# Patient Record
Sex: Female | Born: 2000 | Race: White | Hispanic: No | Marital: Single | Smoking: Never smoker
Health system: Southern US, Community
[De-identification: ages and names within clinical notes are randomized; demographics above are authoritative.]

## PROBLEM LIST (undated history)

## (undated) DIAGNOSIS — R51 Headache: Secondary | ICD-10-CM

## (undated) DIAGNOSIS — Z8489 Family history of other specified conditions: Secondary | ICD-10-CM

## (undated) DIAGNOSIS — R519 Headache, unspecified: Secondary | ICD-10-CM

## (undated) HISTORY — DX: Headache, unspecified: R51.9

## (undated) HISTORY — DX: Headache: R51

---
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
## 2020-03-06 ENCOUNTER — Other Ambulatory Visit: Payer: Self-pay | Admitting: Internal Medicine

## 2020-03-07 ENCOUNTER — Encounter: Payer: Self-pay | Admitting: Internal Medicine

## 2020-03-08 MED ORDER — SUMATRIPTAN SUCCINATE 50 MG PO TABS: 50.0000 mg | ORAL_TABLET | ORAL | 2 refills | Status: DC | PRN

## 2020-04-22 ENCOUNTER — Other Ambulatory Visit: Payer: Self-pay | Admitting: Internal Medicine

## 2020-04-22 DIAGNOSIS — G43839 Menstrual migraine, intractable, without status migrainosus: Secondary | ICD-10-CM

## 2020-05-27 ENCOUNTER — Other Ambulatory Visit: Payer: Self-pay | Admitting: Internal Medicine

## 2020-06-08 ENCOUNTER — Other Ambulatory Visit: Payer: Self-pay | Admitting: Internal Medicine

## 2020-06-08 DIAGNOSIS — G43839 Menstrual migraine, intractable, without status migrainosus: Secondary | ICD-10-CM

## 2020-06-09 ENCOUNTER — Encounter: Payer: Self-pay | Admitting: Internal Medicine

## 2020-06-09 ENCOUNTER — Other Ambulatory Visit: Payer: Self-pay | Admitting: Internal Medicine

## 2020-06-11 ENCOUNTER — Other Ambulatory Visit: Payer: Self-pay

## 2020-06-11 DIAGNOSIS — G43839 Menstrual migraine, intractable, without status migrainosus: Secondary | ICD-10-CM

## 2020-06-11 MED ORDER — TOPIRAMATE 25 MG PO TABS: ORAL_TABLET | ORAL | 0 refills | Status: DC

## 2020-06-11 MED ORDER — SULFACETAMIDE SODIUM (ACNE) 10 % EX LOTN: 1.0000 "application " | TOPICAL_LOTION | Freq: Every day | CUTANEOUS | 2 refills | Status: DC

## 2020-06-11 MED ORDER — SUMATRIPTAN SUCCINATE 50 MG PO TABS: 50.0000 mg | ORAL_TABLET | ORAL | 2 refills | Status: DC | PRN

## 2020-06-11 NOTE — Telephone Encounter (Signed)
Duplicate request

## 2020-06-11 NOTE — Telephone Encounter (Signed)
Imitrex last filled 03/08/2020 x 2 refills... please advise

## 2020-06-25 ENCOUNTER — Encounter: Payer: Self-pay | Admitting: Internal Medicine

## 2020-06-25 NOTE — Telephone Encounter (Signed)
This really needs to be in person

## 2020-06-27 ENCOUNTER — Ambulatory Visit: Payer: BC Managed Care – PPO | Admitting: Internal Medicine

## 2020-06-28 ENCOUNTER — Encounter: Payer: Self-pay | Admitting: Internal Medicine

## 2020-06-28 ENCOUNTER — Other Ambulatory Visit: Payer: Self-pay

## 2020-06-28 ENCOUNTER — Ambulatory Visit (INDEPENDENT_AMBULATORY_CARE_PROVIDER_SITE_OTHER): Payer: Self-pay | Admitting: Internal Medicine

## 2020-06-28 DIAGNOSIS — F419 Anxiety disorder, unspecified: Secondary | ICD-10-CM | POA: Insufficient documentation

## 2020-06-28 DIAGNOSIS — F32A Depression, unspecified: Secondary | ICD-10-CM | POA: Insufficient documentation

## 2020-06-28 DIAGNOSIS — F411 Generalized anxiety disorder: Secondary | ICD-10-CM | POA: Insufficient documentation

## 2020-06-28 DIAGNOSIS — G43839 Menstrual migraine, intractable, without status migrainosus: Secondary | ICD-10-CM

## 2020-06-28 MED ORDER — ESCITALOPRAM OXALATE 5 MG PO TABS: 5.0000 mg | ORAL_TABLET | Freq: Every day | ORAL | 2 refills | Status: DC

## 2020-06-28 NOTE — Assessment & Plan Note (Signed)
Deteriorated due to anxiety Continue Nuvaring, Topamax and Imitrex at this time If symptoms persist, consider increasing Topamax

## 2020-06-28 NOTE — Patient Instructions (Signed)
http://NIMH.NIH.Gov">  Generalized Anxiety Disorder, Adult Generalized anxiety disorder (GAD) is a mental health condition. Unlike normal worries, anxiety related to GAD is not triggered by a specific event. These worries do not fade or get better with time. GAD interferes with relationships, work, and school. GAD symptoms can vary from mild to severe. People with severe GAD can have intense waves of anxiety with physical symptoms that are similar to panic attacks. What are the causes? The exact cause of GAD is not known, but the following are believed to have an impact:  Differences in natural brain chemicals.  Genes passed down from parents to children.  Differences in the way threats are perceived.  Development during childhood.  Personality. What increases the risk? The following factors may make you more likely to develop this condition:  Being female.  Having a family history of anxiety disorders.  Being very shy.  Experiencing very stressful life events, such as the death of a loved one.  Having a very stressful family environment. What are the signs or symptoms? People with GAD often worry excessively about many things in their lives, such as their health and family. Symptoms may also include:  Mental and emotional symptoms: ? Worrying excessively about natural disasters. ? Fear of being late. ? Difficulty concentrating. ? Fears that others are judging your performance.  Physical symptoms: ? Fatigue. ? Headaches, muscle tension, muscle twitches, trembling, or feeling shaky. ? Feeling like your heart is pounding or beating very fast. ? Feeling out of breath or like you cannot take a deep breath. ? Having trouble falling asleep or staying asleep, or experiencing restlessness. ? Sweating. ? Nausea, diarrhea, or irritable bowel syndrome (IBS).  Behavioral symptoms: ? Experiencing erratic moods or irritability. ? Avoidance of new situations. ? Avoidance of  people. ? Extreme difficulty making decisions. How is this diagnosed? This condition is diagnosed based on your symptoms and medical history. You will also have a physical exam. Your health care provider may perform tests to rule out other possible causes of your symptoms. To be diagnosed with GAD, a person must have anxiety that:  Is out of his or her control.  Affects several different aspects of his or her life, such as work and relationships.  Causes distress that makes him or her unable to take part in normal activities.  Includes at least three symptoms of GAD, such as restlessness, fatigue, trouble concentrating, irritability, muscle tension, or sleep problems. Before your health care provider can confirm a diagnosis of GAD, these symptoms must be present more days than they are not, and they must last for 6 months or longer. How is this treated? This condition may be treated with:  Medicine. Antidepressant medicine is usually prescribed for long-term daily control. Anti-anxiety medicines may be added in severe cases, especially when panic attacks occur.  Talk therapy (psychotherapy). Certain types of talk therapy can be helpful in treating GAD by providing support, education, and guidance. Options include: ? Cognitive behavioral therapy (CBT). People learn coping skills and self-calming techniques to ease their physical symptoms. They learn to identify unrealistic thoughts and behaviors and to replace them with more appropriate thoughts and behaviors. ? Acceptance and commitment therapy (ACT). This treatment teaches people how to be mindful as a way to cope with unwanted thoughts and feelings. ? Biofeedback. This process trains you to manage your body's response (physiological response) through breathing techniques and relaxation methods. You will work with a therapist while machines are used to monitor your physical   symptoms.  Stress management techniques. These include yoga,  meditation, and exercise. A mental health specialist can help determine which treatment is best for you. Some people see improvement with one type of therapy. However, other people require a combination of therapies.   Follow these instructions at home: Lifestyle  Maintain a consistent routine and schedule.  Anticipate stressful situations. Create a plan, and allow extra time to work with your plan.  Practice stress management or self-calming techniques that you have learned from your therapist or your health care provider. General instructions  Take over-the-counter and prescription medicines only as told by your health care provider.  Understand that you are likely to have setbacks. Accept this and be kind to yourself as you persist to take better care of yourself.  Recognize and accept your accomplishments, even if you judge them as small.  Keep all follow-up visits as told by your health care provider. This is important. Contact a health care provider if:  Your symptoms do not get better.  Your symptoms get worse.  You have signs of depression, such as: ? A persistently sad or irritable mood. ? Loss of enjoyment in activities that used to bring you joy. ? Change in weight or eating. ? Changes in sleeping habits. ? Avoiding friends or family members. ? Loss of energy for normal tasks. ? Feelings of guilt or worthlessness. Get help right away if:  You have serious thoughts about hurting yourself or others. If you ever feel like you may hurt yourself or others, or have thoughts about taking your own life, get help right away. Go to your nearest emergency department or:  Call your local emergency services (911 in the [place]  Call a suicide crisis helpline, such as the National Suicide Prevention Lifeline at 1-800-273-8255. This is open 24 hours a day in the [place]  Text the Crisis Text Line at 741741 (in the [place] Summary  Generalized anxiety disorder (GAD) is a mental  health condition that involves worry that is not triggered by a specific event.  People with GAD often worry excessively about many things in their lives, such as their health and family.  GAD may cause symptoms such as restlessness, trouble concentrating, sleep problems, frequent sweating, nausea, diarrhea, headaches, and trembling or muscle twitching.  A mental health specialist can help determine which treatment is best for you. Some people see improvement with one type of therapy. However, other people require a combination of therapies. This information is not intended to replace advice given to you by your health care provider. Make sure you discuss any questions you have with your health care provider. Document Revised: 12/22/2018 Document Reviewed: 12/22/2018 Elsevier Patient Education  2021 Elsevier Inc.  

## 2020-06-28 NOTE — Assessment & Plan Note (Signed)
Discussed treatment with medication vs referral for CBT She would like to try medication-discussed risk, benefits, risk of SI RX for Escitalopram 5 mg daily Support offered  Update me in 1 month and let me know how you are doing

## 2020-06-28 NOTE — Progress Notes (Signed)
Subjective:    Patient ID: Denise Jarvis, female    DOB: 25-Jul-2000, 20 y.o.   MRN: [id number]  HPI  Patient presents to the clinic today with complaint of anxiety. She noticed this a few months. She reports feeling on edge, constantly worrying. She is not sleeping well. She reports her schedule has not changed. She denies changes in family or social relationships. She is not sure what triggers this. She repots she has had a low level of anxiety in the past but never treated. She denies depression, SI/H.  She also reports recent ER visit 4/4 for abscess of her right axilla.  She reports she had previously had this drained 01/2020.  She was started on Keflex.  She was discharged advised to follow-up with her PCP.  Since discharge, she reports improvement in the pain and swelling of the abscess of the right axilla.  She has not noticed any drainage.  She denies fever, chills or body aches.  She has completed her course of Keflex.  She also reports persistent migraines. The are typically hormone related. She reports these are occurring every other day. She thinks her anxiety might be making her headaches worse. She is currently managed on the Nuvaring, Topamax and Imitirex. She is not following with neurology.   Review of Systems      Past Medical History:  Diagnosis Date  . Family history of adverse reaction to anesthesia    mother has "severe PONV"  . Frequent headaches     Current Outpatient Medications  Medication Sig Dispense Refill  . etonogestrel-ethinyl estradiol (NUVARING) 0.12-0.015 MG/24HR vaginal ring INSERT VAGINALLY AND LEAVE IN PLACE FOR 3 CONSECUTIVE WEEKS, THEN REMOVE FOR 1 WEEK 4 each 0  . fluticasone (FLONASE) 50 MCG/ACT nasal spray SHAKE LIQUID AND USE 2 SPRAYS IN EACH NOSTRIL DAILY 16 g 6  . minocycline (MINOCIN) 100 MG capsule TAKE 1 CAPSULE(100 MG) BY MOUTH TWICE DAILY 30 capsule 2  . ondansetron (ZOFRAN) 4 MG tablet Take 1 tablet (4 mg total) by mouth every 8  (eight) hours as needed. 20 tablet 0  . Sulfacetamide Sodium, Acne, 10 % LOTN Apply 1 application topically at bedtime. 118 mL 2  . SUMAtriptan (IMITREX) 50 MG tablet Take 1 tablet (50 mg total) by mouth every 2 (two) hours as needed for migraine. May repeat in 2 hours if headache persists or recurs. 10 tablet 2  . topiramate (TOPAMAX) 25 MG tablet TAKE 1 TABLET(25 MG) BY MOUTH AT BEDTIME 90 tablet 0  . tretinoin (RETIN-A) 0.05 % cream      No current facility-administered medications for this visit.    No Known Allergies  Family History  Problem Relation Age of Onset  . Hypertension Mother   . Depression Mother        Post-Partum Depression  . Migraines Neg Hx   . Seizures Neg Hx   . Anxiety disorder Neg Hx   . Bipolar disorder Neg Hx   . Schizophrenia Neg Hx   . ADD / ADHD Neg Hx   . Autism Neg Hx   . Drug abuse Neg Hx   . Suicidality Neg Hx     Social History   Socioeconomic History  . Marital status: Single    Spouse name: Not on file  . Number of children: Not on file  . Years of education: Not on file  . Highest education level: Not on file  Occupational History  . Not on file  Tobacco Use  .  Smoking status: Never Smoker  . Smokeless tobacco: Never Used  Substance and Sexual Activity  . Alcohol use: No  . Drug use: No  . Sexual activity: Never  Other Topics Concern  . Not on file  Social History Narrative   Aalyssa is a 10th grade student [employer]; does well in school. Kaneisha is active in cross country, winter track and spring track.       Kyrene lives with her parents and with [name] her baby sister (dog).       No family history of learning disabilities.    No IEP or 504 plans in school.    Social Determinants of Health   Financial Resource Strain: Not on file  Food Insecurity: Not on file  Transportation Needs: Not on file  Physical Activity: Not on file  Stress: Not on file  Social Connections: Not on file  Intimate Partner  Violence: Not on file     Constitutional: Pt reports headaches. Denies fever, malaise, fatigue, or abrupt weight changes.  Respiratory: Denies difficulty breathing, shortness of breath, cough or sputum production.   Cardiovascular: Denies chest pain, chest tightness, palpitations or swelling in the hands or feet.  Skin: Pt reports healed abscess right axilla. Denies redness, rashes, or ulcercations.  Neurological: Denies dizziness, difficulty with memory, difficulty with speech or problems with balance and coordination.  Psych: Patient reports anxiety.  Denies depression, SI/HI.  No other specific complaints in a complete review of systems (except as listed in HPI above).  Objective:   Physical Exam  BP 106/64   Pulse 71   Temp 98 F (36.7 C) (Temporal)   Wt 127 lb (57.6 kg)   SpO2 98%   BMI 22.68 kg/m   Wt Readings from Last 3 Encounters:  09/01/19 140 lb (63.5 kg) (72 %, Z= 0.58)*  08/09/19 142 lb (64.4 kg) (75 %, Z= 0.66)*  03/14/19 145 lb (65.8 kg) (79 %, Z= 0.81)*   * Growth percentiles are based on CDC (Girls, 2-20 Years) data.    General: Appears her stated age, well developed, well nourished in NAD. Skin: Hyperpigmented area of right axilla noted where previous abscess was. Cardiovascular: Normal rate. Pulmonary/Chest: Normal effort. Neurological: Alert and oriented.  Psychiatric: Mildly anxious appearing.  BMET    Component Value Date/Time   NA 137 09/01/2019 1205   K 3.8 09/01/2019 1205   CL 106 09/01/2019 1205   CO2 26 09/01/2019 1205   GLUCOSE 91 09/01/2019 1205   BUN 10 09/01/2019 1205   CREATININE 0.74 09/01/2019 1205   CALCIUM 9.3 09/01/2019 1205    Lipid Panel     Component Value Date/Time   CHOL 238 (H) 09/01/2019 1205   TRIG 179.0 (H) 09/01/2019 1205   HDL 52.30 09/01/2019 1205   CHOLHDL 5 09/01/2019 1205   VLDL 35.8 09/01/2019 1205   LDLCALC 150 (H) 09/01/2019 1205    CBC    Component Value Date/Time   WBC 9.6 09/01/2019 1205    RBC 4.48 09/01/2019 1205   HGB 13.4 09/01/2019 1205   HCT 39.5 09/01/2019 1205   PLT 235.0 09/01/2019 1205   MCV 88.2 09/01/2019 1205   MCHC 34.0 09/01/2019 1205   RDW 14.0 09/01/2019 1205   LYMPHSABS 3.2 12/06/2015 1324   MONOABS 0.8 12/06/2015 1324   EOSABS 0.1 12/06/2015 1324   BASOSABS 0.0 12/06/2015 1324    Hgb A1C No results found for: HGBA1C         Assessment & Plan:  ER follow-up for Abscess of Right Axilla:  ER notes reviewed Clinically improved, finished her course of antibiotics Advised her if reoccurs, to stop shaving until resolved Apply warm compress and triple antibiotic ointment 3 x day Advised her to clean her razor after each use with alcohol Will monitor symptoms at this time  Return precautions discussed Nicki Reaper, NP This visit occurred during the SARS-CoV-2 public health emergency.  Safety protocols were in place, including screening questions prior to the visit, additional usage of staff PPE, and extensive cleaning of exam room while observing appropriate contact time as indicated for disinfecting solutions.

## 2020-07-06 ENCOUNTER — Other Ambulatory Visit: Payer: Self-pay | Admitting: Internal Medicine

## 2020-07-06 DIAGNOSIS — G43839 Menstrual migraine, intractable, without status migrainosus: Secondary | ICD-10-CM

## 2020-08-30 ENCOUNTER — Other Ambulatory Visit: Payer: Self-pay | Admitting: Internal Medicine

## 2020-08-31 NOTE — Telephone Encounter (Signed)
Refill request nuvaring Last refill 05/29/20 #4 Last office 06/28/20

## 2020-09-25 ENCOUNTER — Other Ambulatory Visit: Payer: Self-pay | Admitting: Internal Medicine

## 2020-09-25 NOTE — Telephone Encounter (Signed)
   Notes to clinic Not a pt in this practice.  

## 2020-09-25 NOTE — Telephone Encounter (Signed)
Last office visit 06/28/2020 for hospital follow up/anxiety/headache with R. Baity.  Last refilled 06/28/2020 for #30 with 2 refills.  No TOC appointments.

## 2020-10-15 ENCOUNTER — Encounter: Payer: BC Managed Care – PPO | Admitting: Internal Medicine

## 2020-10-16 ENCOUNTER — Ambulatory Visit (INDEPENDENT_AMBULATORY_CARE_PROVIDER_SITE_OTHER): Payer: BC Managed Care – PPO | Admitting: Internal Medicine

## 2020-10-16 ENCOUNTER — Encounter: Payer: Self-pay | Admitting: Internal Medicine

## 2020-10-16 ENCOUNTER — Other Ambulatory Visit: Payer: Self-pay

## 2020-10-16 VITALS — BP 113/65 | HR 69 | Temp 97.8°F | Resp 17 | Ht 62.75 in | Wt 130.0 lb

## 2020-10-16 DIAGNOSIS — Z0000 Encounter for general adult medical examination without abnormal findings: Secondary | ICD-10-CM

## 2020-10-16 LAB — POCT URINALYSIS DIPSTICK
Bilirubin, UA: NEGATIVE
Glucose, UA: NEGATIVE
Ketones, UA: NEGATIVE
Leukocytes, UA: NEGATIVE
Nitrite, UA: NEGATIVE
Protein, UA: NEGATIVE
Spec Grav, UA: 1.015 (ref 1.010–1.025)
Urobilinogen, UA: 0.2 E.U./dL
pH, UA: 6.5 (ref 5.0–8.0)

## 2020-10-16 MED ORDER — SUMATRIPTAN SUCCINATE 50 MG PO TABS: 50.0000 mg | ORAL_TABLET | ORAL | 5 refills | Status: DC | PRN

## 2020-10-16 MED ORDER — ETONOGESTREL-ETHINYL ESTRADIOL 0.12-0.015 MG/24HR VA RING: VAGINAL_RING | VAGINAL | 3 refills | Status: DC

## 2020-10-16 MED ORDER — ESCITALOPRAM OXALATE 5 MG PO TABS: ORAL_TABLET | ORAL | 3 refills | Status: DC

## 2020-10-16 NOTE — Progress Notes (Signed)
Subjective:    Patient ID: Denise Jarvis, female    DOB: [birth year], 20 y.o.   MRN: [id number]  HPI  Pt presents to the clinic today for her annual exam. She would like a refill of her Escitalopram, Imitrex and Nuvaring today. She has a physical form she needs completed for college.  Flu: never Tetanus: 09/2019 Covid: never Dentist: biannually  Diet: She does eat meat. She consumes fruits and veggies. She does eat fried foods. She drinks mostly water, juice, soda, exercise Exercise: Cheering in college   Review of Systems     Past Medical History:  Diagnosis Date   Family history of adverse reaction to anesthesia    mother has "severe PONV"   Frequent headaches     Current Outpatient Medications  Medication Sig Dispense Refill   escitalopram (LEXAPRO) 5 MG tablet TAKE 1 TABLET(5 MG) BY MOUTH DAILY 30 tablet 2   etonogestrel-ethinyl estradiol (NUVARING) 0.12-0.015 MG/24HR vaginal ring INSERT 1 RING IN THE VAGINA AND LEAVE IN PLACE FOR 3 CONSECUTIVE WEEKS, THEN REMOVE FOR 1 WEEK 4 each 0   fluticasone (FLONASE) 50 MCG/ACT nasal spray SHAKE LIQUID AND USE 2 SPRAYS IN EACH NOSTRIL DAILY 16 g 6   minocycline (MINOCIN) 100 MG capsule TAKE 1 CAPSULE(100 MG) BY MOUTH TWICE DAILY 30 capsule 2   Sulfacetamide Sodium, Acne, 10 % LOTN Apply 1 application topically at bedtime. 118 mL 2   SUMAtriptan (IMITREX) 50 MG tablet Take 1 tablet (50 mg total) by mouth every 2 (two) hours as needed for migraine. May repeat in 2 hours if headache persists or recurs. 10 tablet 2   topiramate (TOPAMAX) 25 MG tablet TAKE 1 TABLET(25 MG) BY MOUTH AT BEDTIME 90 tablet 0   tretinoin (RETIN-A) 0.05 % cream      No current facility-administered medications for this visit.    No Known Allergies  Family History  Problem Relation Age of Onset   Hypertension Mother    Depression Mother        Post-Partum Depression   Migraines Neg Hx    Seizures Neg Hx    Anxiety disorder Neg Hx    Bipolar disorder  Neg Hx    Schizophrenia Neg Hx    ADD / ADHD Neg Hx    Autism Neg Hx    Drug abuse Neg Hx    Suicidality Neg Hx     Social History   Socioeconomic History   Marital status: Single    Spouse name: Not on file   Number of children: Not on file   Years of education: Not on file   Highest education level: Not on file  Occupational History   Not on file  Tobacco Use   Smoking status: Never   Smokeless tobacco: Never  Substance and Sexual Activity   Alcohol use: No   Drug use: No   Sexual activity: Never  Other Topics Concern   Not on file  Social History Narrative   Camala is a 10th grade student [employer]; does well in school. Takina is active in cross country, winter track and spring track.       Cherlyn lives with her parents and with [name] her baby sister (dog).       No family history of learning disabilities.    No IEP or 504 plans in school.    Social Determinants of Health   Financial Resource Strain: Not on file  Food Insecurity: Not on file  Transportation Needs: Not  on file  Physical Activity: Not on file  Stress: Not on file  Social Connections: Not on file  Intimate Partner Violence: Not on file     Constitutional: Pt reports intermittent headaches. Denies fever, malaise, fatigue, or abrupt weight changes.  HEENT: Denies eye pain, eye redness, ear pain, ringing in the ears, wax buildup, runny nose, nasal congestion, bloody nose, or sore throat. Respiratory: Denies difficulty breathing, shortness of breath, cough or sputum production.   Cardiovascular: Denies chest pain, chest tightness, palpitations or swelling in the hands or feet.  Gastrointestinal: Denies abdominal pain, bloating, constipation, diarrhea or blood in the stool.  GU: Denies urgency, frequency, pain with urination, burning sensation, blood in urine, odor or discharge. Musculoskeletal: Denies decrease in range of motion, difficulty with gait, muscle pain or joint pain and  swelling.  Skin: Denies redness, rashes, lesions or ulcercations.  Neurological: Denies dizziness, difficulty with memory, difficulty with speech or problems with balance and coordination.  Psych: Pt has a history of anxiety. Denies depression, SI/HI.  No other specific complaints in a complete review of systems (except as listed in HPI above).  Objective:   Physical Exam  BP 113/65 (BP Location: Right Arm, Patient Position: Sitting, Cuff Size: Normal)   Pulse 69   Temp 97.8 F (36.6 C) (Temporal)   Resp 17   Ht 5' 2.75" (1.594 m)   Wt 130 lb (59 kg)   LMP 10/11/2020   SpO2 99%   BMI 23.21 kg/m   Wt Readings from Last 3 Encounters:  06/28/20 127 lb (57.6 kg) (48 %, Z= -0.05)*  09/01/19 140 lb (63.5 kg) (72 %, Z= 0.58)*  08/09/19 142 lb (64.4 kg) (75 %, Z= 0.66)*   * Growth percentiles are based on CDC (Girls, 2-20 Years) data.    General: Appears her stated age, well developed, well nourished in NAD. Skin: Warm, dry and intact. No rashes noted. HEENT: Head: normal shape and size; Eyes: sclera white and EOMs intact;  Neck:  Neck supple, trachea midline. No masses, lumps or thyromegaly present.  Cardiovascular: Normal rate and rhythm. S1,S2 noted.  No murmur, rubs or gallops noted. No JVD or BLE edema.  Pulmonary/Chest: Normal effort and positive vesicular breath sounds. No respiratory distress. No wheezes, rales or ronchi noted.  Abdomen: Soft and nontender. Normal bowel sounds. No distention or masses noted. Liver, spleen and kidneys non palpable. Musculoskeletal: Strength 5/5 BUE/BLE. No difficulty with gait.  Neurological: Alert and oriented. Cranial nerves II-XII grossly intact. Coordination normal.  Psychiatric: Mood and affect normal. Behavior is normal. Judgment and thought content normal.    BMET    Component Value Date/Time   NA 137 09/01/2019 1205   K 3.8 09/01/2019 1205   CL 106 09/01/2019 1205   CO2 26 09/01/2019 1205   GLUCOSE 91 09/01/2019 1205   BUN 10  09/01/2019 1205   CREATININE 0.74 09/01/2019 1205   CALCIUM 9.3 09/01/2019 1205    Lipid Panel     Component Value Date/Time   CHOL 238 (H) 09/01/2019 1205   TRIG 179.0 (H) 09/01/2019 1205   HDL 52.30 09/01/2019 1205   CHOLHDL 5 09/01/2019 1205   VLDL 35.8 09/01/2019 1205   LDLCALC 150 (H) 09/01/2019 1205    CBC    Component Value Date/Time   WBC 9.6 09/01/2019 1205   RBC 4.48 09/01/2019 1205   HGB 13.4 09/01/2019 1205   HCT 39.5 09/01/2019 1205   PLT 235.0 09/01/2019 1205   MCV 88.2 09/01/2019 1205  MCHC 34.0 09/01/2019 1205   RDW 14.0 09/01/2019 1205   LYMPHSABS 3.2 12/06/2015 1324   MONOABS 0.8 12/06/2015 1324   EOSABS 0.1 12/06/2015 1324   BASOSABS 0.0 12/06/2015 1324    Hgb A1C No results found for: HGBA1C          Assessment & Plan:   Preventative Health Maintenance, Encounter for Form Completion:  Encouraged her to get a flu shot in the fall Tetanus UTD Encouraged her to get her Covid vaccine Encouraged her to consume a balanced diet and exercise regimen Advised her to see a dentist annually Form filled out, original given back to patient Will hold off on labs today Urinalysis (needed for form): normal  RTC in 1 year, sooner if needed Nicki Reaper, NP This visit occurred during the SARS-CoV-2 public health emergency.  Safety protocols were in place, including screening questions prior to the visit, additional usage of staff PPE, and extensive cleaning of exam room while observing appropriate contact time as indicated for disinfecting solutions.

## 2020-10-16 NOTE — Patient Instructions (Signed)
Health Maintenance, Female Adopting a healthy lifestyle and getting preventive care are important in promoting health and wellness. Ask your health care provider about: The right schedule for you to have regular tests and exams. Things you can do on your own to prevent diseases and keep yourself healthy. What should I know about diet, weight, and exercise? Eat a healthy diet  Eat a diet that includes plenty of vegetables, fruits, low-fat dairy products, and lean protein. Do not eat a lot of foods that are high in solid fats, added sugars, or sodium.  Maintain a healthy weight Body mass index (BMI) is used to identify weight problems. It estimates body fat based on height and weight. Your health care provider can help determineyour BMI and help you achieve or maintain a healthy weight. Get regular exercise Get regular exercise. This is one of the most important things you can do for your health. Most adults should: Exercise for at least 150 minutes each week. The exercise should increase your heart rate and make you sweat (moderate-intensity exercise). Do strengthening exercises at least twice a week. This is in addition to the moderate-intensity exercise. Spend less time sitting. Even light physical activity can be beneficial. Watch cholesterol and blood lipids Have your blood tested for lipids and cholesterol at 20 years of age, then havethis test every 5 years. Have your cholesterol levels checked more often if: Your lipid or cholesterol levels are high. You are older than 20 years of age. You are at high risk for heart disease. What should I know about cancer screening? Depending on your health history and family history, you may need to have cancer screening at various ages. This may include screening for: Breast cancer. Cervical cancer. Colorectal cancer. Skin cancer. Lung cancer. What should I know about heart disease, diabetes, and high blood pressure? Blood pressure and heart  disease High blood pressure causes heart disease and increases the risk of stroke. This is more likely to develop in people who have high blood pressure readings, are of African descent, or are overweight. Have your blood pressure checked: Every 3-5 years if you are 18-39 years of age. Every year if you are 40 years old or older. Diabetes Have regular diabetes screenings. This checks your fasting blood sugar level. Have the screening done: Once every three years after age 40 if you are at a normal weight and have a low risk for diabetes. More often and at a younger age if you are overweight or have a high risk for diabetes. What should I know about preventing infection? Hepatitis B If you have a higher risk for hepatitis B, you should be screened for this virus. Talk with your health care provider to find out if you are at risk forhepatitis B infection. Hepatitis C Testing is recommended for: Everyone born from 1945 through 1965. Anyone with known risk factors for hepatitis C. Sexually transmitted infections (STIs) Get screened for STIs, including gonorrhea and chlamydia, if: You are sexually active and are younger than 20 years of age. You are older than 20 years of age and your health care provider tells you that you are at risk for this type of infection. Your sexual activity has changed since you were last screened, and you are at increased risk for chlamydia or gonorrhea. Ask your health care provider if you are at risk. Ask your health care provider about whether you are at high risk for HIV. Your health care provider may recommend a prescription medicine to help   prevent HIV infection. If you choose to take medicine to prevent HIV, you should first get tested for HIV. You should then be tested every 3 months for as long as you are taking the medicine. Pregnancy If you are about to stop having your period (premenopausal) and you may become pregnant, seek counseling before you get  pregnant. Take 400 to 800 micrograms (mcg) of folic acid every day if you become pregnant. Ask for birth control (contraception) if you want to prevent pregnancy. Osteoporosis and menopause Osteoporosis is a disease in which the bones lose minerals and strength with aging. This can result in bone fractures. If you are 65 years old or older, or if you are at risk for osteoporosis and fractures, ask your health care provider if you should: Be screened for bone loss. Take a calcium or vitamin D supplement to lower your risk of fractures. Be given hormone replacement therapy (HRT) to treat symptoms of menopause. Follow these instructions at home: Lifestyle Do not use any products that contain nicotine or tobacco, such as cigarettes, e-cigarettes, and chewing tobacco. If you need help quitting, ask your health care provider. Do not use street drugs. Do not share needles. Ask your health care provider for help if you need support or information about quitting drugs. Alcohol use Do not drink alcohol if: Your health care provider tells you not to drink. You are pregnant, may be pregnant, or are planning to become pregnant. If you drink alcohol: Limit how much you use to 0-1 drink a day. Limit intake if you are breastfeeding. Be aware of how much alcohol is in your drink. In the [place] one drink equals one 12 oz bottle of beer (355 mL), one 5 oz glass of wine (148 mL), or one 1 oz glass of hard liquor (44 mL). General instructions Schedule regular health, dental, and eye exams. Stay current with your vaccines. Tell your health care provider if: You often feel depressed. You have ever been abused or do not feel safe at home. Summary Adopting a healthy lifestyle and getting preventive care are important in promoting health and wellness. Follow your health care provider's instructions about healthy diet, exercising, and getting tested or screened for diseases. Follow your health care provider's  instructions on monitoring your cholesterol and blood pressure. This information is not intended to replace advice given to you by your health care provider. Make sure you discuss any questions you have with your healthcare provider. Document Revised: 02/24/2018 Document Reviewed: 02/24/2018 Elsevier Patient Education  2022 Elsevier Inc.  

## 2020-11-22 ENCOUNTER — Other Ambulatory Visit: Payer: Self-pay | Admitting: Family

## 2021-03-16 ENCOUNTER — Other Ambulatory Visit: Payer: Self-pay | Admitting: Internal Medicine

## 2021-03-16 DIAGNOSIS — G43839 Menstrual migraine, intractable, without status migrainosus: Secondary | ICD-10-CM

## 2021-03-16 NOTE — Telephone Encounter (Signed)
Requested medication (s) are due for refill today: yes  Requested medication (s) are on the active medication list: yes  Last refill:  12/19/20  Future visit scheduled: no  Notes to clinic:  This medication can not be delegated, please assess.  Requested Prescriptions  Pending Prescriptions Disp Refills   topiramate (TOPAMAX) 25 MG tablet [Pharmacy Med Name: TOPIRAMATE 25MG  TABLETS] 90 tablet 0    Sig: TAKE 1 TABLET(25 MG) BY MOUTH AT BEDTIME     There is no refill protocol information for this order

## 2021-03-18 ENCOUNTER — Other Ambulatory Visit: Payer: Self-pay | Admitting: Internal Medicine

## 2021-03-18 DIAGNOSIS — G43839 Menstrual migraine, intractable, without status migrainosus: Secondary | ICD-10-CM

## 2021-03-19 NOTE — Telephone Encounter (Signed)
Duplicate request refused. Medication previously filled on 03/19/20.

## 2021-03-20 ENCOUNTER — Encounter: Payer: Self-pay | Admitting: Internal Medicine

## 2021-03-20 DIAGNOSIS — G43839 Menstrual migraine, intractable, without status migrainosus: Secondary | ICD-10-CM

## 2021-03-20 MED ORDER — TOPIRAMATE 25 MG PO TABS: ORAL_TABLET | ORAL | 1 refills | Status: DC

## 2021-03-20 MED ORDER — SUMATRIPTAN SUCCINATE 50 MG PO TABS: 50.0000 mg | ORAL_TABLET | ORAL | 5 refills | Status: DC | PRN

## 2021-07-18 ENCOUNTER — Ambulatory Visit (INDEPENDENT_AMBULATORY_CARE_PROVIDER_SITE_OTHER): Payer: BC Managed Care – PPO | Admitting: Internal Medicine

## 2021-07-18 ENCOUNTER — Encounter: Payer: Self-pay | Admitting: Internal Medicine

## 2021-07-18 VITALS — BP 110/72 | HR 72 | Temp 97.5°F | Ht 62.75 in | Wt 125.0 lb

## 2021-07-18 DIAGNOSIS — Z0001 Encounter for general adult medical examination with abnormal findings: Secondary | ICD-10-CM | POA: Diagnosis not present

## 2021-07-18 DIAGNOSIS — L678 Other hair color and hair shaft abnormalities: Secondary | ICD-10-CM | POA: Diagnosis not present

## 2021-07-18 DIAGNOSIS — R5383 Other fatigue: Secondary | ICD-10-CM

## 2021-07-18 DIAGNOSIS — F419 Anxiety disorder, unspecified: Secondary | ICD-10-CM

## 2021-07-18 DIAGNOSIS — E785 Hyperlipidemia, unspecified: Secondary | ICD-10-CM | POA: Insufficient documentation

## 2021-07-18 DIAGNOSIS — Z114 Encounter for screening for human immunodeficiency virus [HIV]: Secondary | ICD-10-CM

## 2021-07-18 DIAGNOSIS — Z1159 Encounter for screening for other viral diseases: Secondary | ICD-10-CM | POA: Diagnosis not present

## 2021-07-18 DIAGNOSIS — R112 Nausea with vomiting, unspecified: Secondary | ICD-10-CM

## 2021-07-18 DIAGNOSIS — L603 Nail dystrophy: Secondary | ICD-10-CM | POA: Diagnosis not present

## 2021-07-18 DIAGNOSIS — F32A Depression, unspecified: Secondary | ICD-10-CM

## 2021-07-18 MED ORDER — ETONOGESTREL-ETHINYL ESTRADIOL 0.12-0.015 MG/24HR VA RING: VAGINAL_RING | VAGINAL | 1 refills | Status: DC

## 2021-07-18 MED ORDER — ESCITALOPRAM OXALATE 10 MG PO TABS: 10.0000 mg | ORAL_TABLET | Freq: Every day | ORAL | 1 refills | Status: DC

## 2021-07-18 NOTE — Progress Notes (Signed)
? ?Subjective:  ? ? Patient ID: Denise Jarvis, female    DOB: Jul 18, 2000, 21 y.o.   MRN: [id number] ? ?HPI ? ?Pt presents to the clinic today for her annual exam. ? ?She is concerned about vitamin deficiency. She has noticed brittle hair, nails, persistent nausea with intermittent vomiting for the last few weeks. She is not sure if is it related to her medications but she always takes these with a snack. She denies abdominal pain, reflux, diarrhea, constipation or blood in her stool.  ? ?She also wants to follow up on anxiety and depression. She reports she she has been feeling stressed and overwhelmed, mostly related to school. She is home on summer break and does feel like this has gotten somewhat better in the last few weeks. She is not currently seeing a therapist. She denies SI/HI. She is taking Escitalopram as prescribed. ? ?Flu: never ?Tetanus: 09/2019 ?Covid: never ?Pap smear: never, never been sexually active ?Dentist: biannually ? ?Diet: She does eat some meat. She consumes some fruits and veggies. She does eat some fried food. She drinks mostly water, soda, juice ?Exercise: None ? ?Review of Systems ? ?Past Medical History:  ?Diagnosis Date  ? Family history of adverse reaction to anesthesia   ? mother has "severe PONV"  ? Frequent headaches   ? ? ?Current Outpatient Medications  ?Medication Sig Dispense Refill  ? escitalopram (LEXAPRO) 5 MG tablet TAKE 1 TABLET(5 MG) BY MOUTH DAILY 90 tablet 3  ? etonogestrel-ethinyl estradiol (NUVARING) 0.12-0.015 MG/24HR vaginal ring INSERT 1 RING IN THE VAGINA AND LEAVE IN PLACE FOR 3 CONSECUTIVE WEEKS, THEN REMOVE FOR 1 WEEK 4 each 3  ? fluticasone (FLONASE) 50 MCG/ACT nasal spray SHAKE LIQUID AND USE 2 SPRAYS IN EACH NOSTRIL DAILY 16 g 6  ? minocycline (MINOCIN) 100 MG capsule TAKE 1 CAPSULE(100 MG) BY MOUTH TWICE DAILY 30 capsule 2  ? Sulfacetamide Sodium, Acne, 10 % LOTN Apply 1 application topically at bedtime. 118 mL 2  ? SUMAtriptan (IMITREX) 50 MG tablet Take 1  tablet (50 mg total) by mouth every 2 (two) hours as needed for migraine. May repeat in 2 hours if headache persists or recurs. 10 tablet 5  ? topiramate (TOPAMAX) 25 MG tablet Take 1 tablet (25MG) BY mouth at bedtime 90 tablet 1  ? tretinoin (RETIN-A) 0.05 % cream     ? ?No current facility-administered medications for this visit.  ? ? ?No Known Allergies ? ?Family History  ?Problem Relation Age of Onset  ? Hypertension Mother   ? Depression Mother   ?     Post-Partum Depression  ? Migraines Neg Hx   ? Seizures Neg Hx   ? Anxiety disorder Neg Hx   ? Bipolar disorder Neg Hx   ? Schizophrenia Neg Hx   ? ADD / ADHD Neg Hx   ? Autism Neg Hx   ? Drug abuse Neg Hx   ? Suicidality Neg Hx   ? ? ?Social History  ? ?Socioeconomic History  ? Marital status: Single  ?  Spouse name: Not on file  ? Number of children: Not on file  ? Years of education: Not on file  ? Highest education level: Not on file  ?Occupational History  ? Not on file  ?Tobacco Use  ? Smoking status: Never  ? Smokeless tobacco: Never  ?Vaping Use  ? Vaping Use: Never used  ?Substance and Sexual Activity  ? Alcohol use: No  ? Drug use: No  ? Sexual  activity: Never  ?Other Topics Concern  ? Not on file  ?Social History Narrative  ? Adra is a 10th grade student [employer]; does well in school. Bellamie is active in cross country, winter track and spring track.   ?   ? Danasha lives with her parents and with Ward Chatters her baby sister (dog).   ?   ? No family history of learning disabilities.   ? No IEP or 504 plans in school.   ? ?Social Determinants of Health  ? ?Financial Resource Strain: Not on file  ?Food Insecurity: Not on file  ?Transportation Needs: Not on file  ?Physical Activity: Not on file  ?Stress: Not on file  ?Social Connections: Not on file  ?Intimate Partner Violence: Not on file  ? ? ? ?Constitutional: Pt reports intermittent headaches, fatigue. Denies fever, malaise, or abrupt weight changes.  ?HEENT: Denies eye pain, eye redness,  ear pain, ringing in the ears, wax buildup, runny nose, nasal congestion, bloody nose, or sore throat. ?Respiratory: Denies difficulty breathing, shortness of breath, cough or sputum production.   ?Cardiovascular: Denies chest pain, chest tightness, palpitations or swelling in the hands or feet.  ?Gastrointestinal: Pt reports chronic nausea and intermittent vomiting. Denies abdominal pain, bloating, constipation, diarrhea or blood in the stool.  ?GU: Denies urgency, frequency, pain with urination, burning sensation, blood in urine, odor or discharge. ?Musculoskeletal: Denies decrease in range of motion, difficulty with gait, muscle pain or joint pain and swelling.  ?Skin: Pt reports brittle hair and nails. Denies redness, rashes, lesions or ulcercations.  ?Neurological: Denies dizziness, difficulty with memory, difficulty with speech or problems with balance and coordination.  ?Psych: Pt has a history of anxiety, reports depression. Denies SI/HI. ? ?No other specific complaints in a complete review of systems (except as listed in HPI above). ? ?   ?Objective:  ? Physical Exam ? ?BP 110/72 (BP Location: Left Arm, Patient Position: Sitting, Cuff Size: Normal)   Pulse 72   Temp (!) 97.5 ?F (36.4 ?C) (Temporal)   Ht 5' 2.75" (1.594 m)   Wt 125 lb (56.7 kg)   SpO2 97%   BMI 22.32 kg/m?  ? ?Wt Readings from Last 3 Encounters:  ?10/16/20 130 lb (59 kg)  ?06/28/20 127 lb (57.6 kg) (48 %, Z= -0.05)*  ?09/01/19 140 lb (63.5 kg) (72 %, Z= 0.58)*  ? ?* Growth percentiles are based on CDC (Girls, 2-20 Years) data.  ? ? ?General: Appears her stated age, well developed, well nourished in NAD. ?Skin: Warm, dry and intact. Brittle hair noted. ?HEENT: Head: normal shape and size; Eyes: sclera white, no icterus, conjunctiva pink, PERRLA and EOMs intact;  ?Neck:  Neck supple, trachea midline. No masses, lumps or thyromegaly present.  ?Cardiovascular: Normal rate and rhythm. S1,S2 noted.  No murmur, rubs or gallops noted. No JVD  or BLE edema.  ?Pulmonary/Chest: Normal effort and positive vesicular breath sounds. No respiratory distress. No wheezes, rales or ronchi noted.  ?Abdomen: Soft and nontender. Normal bowel sounds. ?Musculoskeletal: Strength 5/5 BUE/BLE. No difficulty with gait.  ?Neurological: Alert and oriented. Cranial nerves II-XII grossly intact. Coordination normal.  ?Psychiatric: Mildly anxious appearing. Behavior is normal. Judgment and thought content normal.  ? ? ? ?BMET ?   ?Component Value Date/Time  ? NA 137 09/01/2019 1205  ? K 3.8 09/01/2019 1205  ? CL 106 09/01/2019 1205  ? CO2 26 09/01/2019 1205  ? GLUCOSE 91 09/01/2019 1205  ? BUN 10 09/01/2019 1205  ? CREATININE  0.74 09/01/2019 1205  ? CALCIUM 9.3 09/01/2019 1205  ? ? ?Lipid Panel  ?   ?Component Value Date/Time  ? CHOL 238 (H) 09/01/2019 1205  ? TRIG 179.0 (H) 09/01/2019 1205  ? HDL 52.30 09/01/2019 1205  ? CHOLHDL 5 09/01/2019 1205  ? VLDL 35.8 09/01/2019 1205  ? LDLCALC 150 (H) 09/01/2019 1205  ? ? ?CBC ?   ?Component Value Date/Time  ? WBC 9.6 09/01/2019 1205  ? RBC 4.48 09/01/2019 1205  ? HGB 13.4 09/01/2019 1205  ? HCT 39.5 09/01/2019 1205  ? PLT 235.0 09/01/2019 1205  ? MCV 88.2 09/01/2019 1205  ? MCHC 34.0 09/01/2019 1205  ? RDW 14.0 09/01/2019 1205  ? LYMPHSABS 3.2 12/06/2015 1324  ? MONOABS 0.8 12/06/2015 1324  ? EOSABS 0.1 12/06/2015 1324  ? BASOSABS 0.0 12/06/2015 1324  ? ? ?Hgb A1C ?No results found for: HGBA1C ? ? ? ? ? ? ?   ?Assessment & Plan:  ? ?Preventative Health Maintenance: ? ?Encouraged her to get a flu shot in the fall ?Tetanus UTD ?Encouraged her to get her COVID-vaccine ?Pap smear due 09/2021, will do this at her follow up visit ?Encouraged her to consume a balanced diet and exercise regimen ?Advised her to see a dentist annually ?We will check CBC, c-Met, lipid, TSH, A1c, HIV and hep C today ? ?Fatigue, Brittle Hair and Nails, Nausea and Vomiting: ? ?Could be r/t mood vs some sort of reflux ?Will check CBC, CMET, TSH, Vit D and B12  today ?Encouraged exercise for 3 minutes at least 3 x week ?If labs normal, consider adding Famotidine 10 mg daily at bedtime ? ?RTC in 6 months, follow-up chronic conditions ?Webb Silversmith, NP ? ?

## 2021-07-18 NOTE — Assessment & Plan Note (Addendum)
Deteriorated ?PHQ 9 score of 17 ?Will increase Escitalopram to 10 mg daily ?Support offered ?

## 2021-07-18 NOTE — Patient Instructions (Signed)

## 2021-07-19 LAB — VITAMIN B12: Vitamin B-12: 317 pg/mL (ref 200–1100)

## 2021-07-19 LAB — COMPLETE METABOLIC PANEL WITH GFR
AG Ratio: 1.9 (calc) (ref 1.0–2.5)
ALT: 12 U/L (ref 6–29)
AST: 13 U/L (ref 10–30)
Albumin: 4.9 g/dL (ref 3.6–5.1)
Alkaline phosphatase (APISO): 83 U/L (ref 31–125)
BUN: 11 mg/dL (ref 7–25)
CO2: 24 mmol/L (ref 20–32)
Calcium: 10 mg/dL (ref 8.6–10.2)
Chloride: 105 mmol/L (ref 98–110)
Creat: 0.81 mg/dL (ref 0.50–0.96)
Globulin: 2.6 g/dL (calc) (ref 1.9–3.7)
Glucose, Bld: 94 mg/dL (ref 65–99)
Potassium: 3.6 mmol/L (ref 3.5–5.3)
Sodium: 140 mmol/L (ref 135–146)
Total Bilirubin: 0.4 mg/dL (ref 0.2–1.2)
Total Protein: 7.5 g/dL (ref 6.1–8.1)
eGFR: 107 mL/min/{1.73_m2} (ref >=60)

## 2021-07-19 LAB — VITAMIN D 25 HYDROXY (VIT D DEFICIENCY, FRACTURES): Vit D, 25-Hydroxy: 37 ng/mL (ref 30–100)

## 2021-07-19 LAB — CBC
HCT: 43.7 % (ref 35.0–45.0)
Hemoglobin: 14.7 g/dL (ref 11.7–15.5)
MCH: 30.6 pg (ref 27.0–33.0)
MCHC: 33.6 g/dL (ref 32.0–36.0)
MCV: 91 fL (ref 80.0–100.0)
MPV: 10.6 fL (ref 7.5–12.5)
Platelets: 229 10*3/uL (ref 140–400)
RBC: 4.8 10*6/uL (ref 3.80–5.10)
RDW: 13.4 % (ref 11.0–15.0)
WBC: 7.1 10*3/uL (ref 3.8–10.8)

## 2021-07-19 LAB — HEMOGLOBIN A1C
Hgb A1c MFr Bld: 5.3 % of total Hgb (ref <5.7)
Mean Plasma Glucose: 105 mg/dL
eAG (mmol/L): 5.8 mmol/L

## 2021-07-19 LAB — LIPID PANEL
Cholesterol: 252 mg/dL — ABNORMAL HIGH (ref <200)
HDL: 59 mg/dL (ref >=50)
LDL Cholesterol (Calc): 162 mg/dL (calc) — ABNORMAL HIGH
Non-HDL Cholesterol (Calc): 193 mg/dL (calc) — ABNORMAL HIGH (ref <130)
Total CHOL/HDL Ratio: 4.3 (calc) (ref <5.0)
Triglycerides: 160 mg/dL — ABNORMAL HIGH (ref <150)

## 2021-07-19 LAB — HEPATITIS C ANTIBODY
Hepatitis C Ab: NONREACTIVE
SIGNAL TO CUT-OFF: 0.12 (ref <1.00)

## 2021-07-19 LAB — HIV ANTIBODY (ROUTINE TESTING W REFLEX): HIV 1&2 Ab, 4th Generation: NONREACTIVE

## 2021-07-19 LAB — TSH: TSH: 0.93 mIU/L

## 2021-07-24 ENCOUNTER — Telehealth: Payer: Self-pay

## 2021-07-24 DIAGNOSIS — E782 Mixed hyperlipidemia: Secondary | ICD-10-CM

## 2021-07-24 MED ORDER — SIMVASTATIN 10 MG PO TABS: 10.0000 mg | ORAL_TABLET | Freq: Every day | ORAL | 0 refills | Status: DC

## 2021-07-24 NOTE — Telephone Encounter (Signed)
Pt advised.  She agreed to start a cholesterol medicine.  Please send to [facility].  Apt schedule for  10/30/2021 at 8:20 am. ? ? ?Thanks,  ? ?-Mickel Baas  ?

## 2021-07-24 NOTE — Telephone Encounter (Signed)
-----   Message from Lorre Munroe, NP sent at 07/19/2021 11:53 AM EDT ----- ?Cholesterol is markedly elevated given her age.  I would recommend cholesterol-lowering medication at this time.  Please let me know if she is agreeable and I will send this in.  Blood counts are normal.  Liver and kidney function is normal.  Thyroid function is normal.  She does not have diabetes.  Vitamin D is normal.  B12 is normal.  Have her start taking biotin 10,000 mcg daily for the brittle hair or nails.  I would like for her to follow-up with me in 3 months. ?

## 2021-07-24 NOTE — Telephone Encounter (Signed)
I have sent in simvastatin. Needs lab only appt in 3 months for repeat cholesterol. ?

## 2021-07-24 NOTE — Addendum Note (Signed)
Addended by: Jearld Fenton on: 07/24/2021 02:20 PM ? ? Modules accepted: Orders ? ?

## 2021-07-26 ENCOUNTER — Other Ambulatory Visit: Payer: Self-pay

## 2021-07-26 DIAGNOSIS — Z021 Encounter for pre-employment examination: Secondary | ICD-10-CM

## 2021-07-26 NOTE — Progress Notes (Signed)
Presents to [facility] [employer] for on-site pre-employment drug screen. ? ?[facility] Acct #:  [id number] ?[facility] Specimen #:  [id number] ? ?Rapid drug screen results = Negative ? ?AMD ?

## 2021-09-14 ENCOUNTER — Other Ambulatory Visit: Payer: Self-pay | Admitting: Internal Medicine

## 2021-09-14 DIAGNOSIS — G43839 Menstrual migraine, intractable, without status migrainosus: Secondary | ICD-10-CM

## 2021-09-16 NOTE — Telephone Encounter (Signed)
Will refuse request at this time.  Requested Prescriptions  Refused Prescriptions Disp Refills  . topiramate (TOPAMAX) 25 MG tablet [Pharmacy Med Name: TOPIRAMATE 25MG  TABLETS] 90 tablet 1    Sig: TAKE 1 TABLET(25 MG) BY MOUTH AT BEDTIME     Neurology: Anticonvulsants - topiramate & zonisamide Passed - 09/14/2021  5:24 PM      Passed - Cr in normal range and within 360 days    Creat  Date Value Ref Range Status  07/18/2021 0.81 0.50 - 0.96 mg/dL Final         Passed - CO2 in normal range and within 360 days    CO2  Date Value Ref Range Status  07/18/2021 24 20 - 32 mmol/L Final         Passed - ALT in normal range and within 360 days    ALT  Date Value Ref Range Status  07/18/2021 12 6 - 29 U/L Final         Passed - AST in normal range and within 360 days    AST  Date Value Ref Range Status  07/18/2021 13 10 - 30 U/L Final         Passed - Completed PHQ-2 or PHQ-9 in the last 360 days      Passed - Valid encounter within last 12 months    Recent Outpatient Visits          2 months ago Encounter for general adult medical examination with abnormal findings   [facility] [place], Mullins, NP   11 months ago Annual physical exam   [facility] [place], Mullins, NP      Future Appointments            In 1 month Baity, Salvadore Oxford, NP [facility], [facility]

## 2021-09-16 NOTE — Telephone Encounter (Signed)
Requested Prescriptions  Pending Prescriptions Disp Refills  . topiramate (TOPAMAX) 25 MG tablet [Pharmacy Med Name: TOPIRAMATE 25MG  TABLETS] 90 tablet 3    Sig: TAKE 1 TABLET(25 MG) BY MOUTH AT BEDTIME     Neurology: Anticonvulsants - topiramate & zonisamide Passed - 09/14/2021  3:33 AM      Passed - Cr in normal range and within 360 days    Creat  Date Value Ref Range Status  07/18/2021 0.81 0.50 - 0.96 mg/dL Final         Passed - CO2 in normal range and within 360 days    CO2  Date Value Ref Range Status  07/18/2021 24 20 - 32 mmol/L Final         Passed - ALT in normal range and within 360 days    ALT  Date Value Ref Range Status  07/18/2021 12 6 - 29 U/L Final         Passed - AST in normal range and within 360 days    AST  Date Value Ref Range Status  07/18/2021 13 10 - 30 U/L Final         Passed - Completed PHQ-2 or PHQ-9 in the last 360 days      Passed - Valid encounter within last 12 months    Recent Outpatient Visits          2 months ago Encounter for general adult medical examination with abnormal findings   [facility] [place], Mullins, NP   11 months ago Annual physical exam   [facility] [place], Mullins, NP      Future Appointments            In 1 month Baity, Salvadore Oxford, NP [facility], [facility]

## 2021-09-16 NOTE — Telephone Encounter (Signed)
Patient called, left VM to return the call to the office. Will need to know if she is requesting the refill of Topamax to go to [facility] [place], [place]? This is a duplicate request and the medication refill was sent today to [id number] - [place], [place].

## 2021-10-29 ENCOUNTER — Encounter: Payer: Self-pay | Admitting: Internal Medicine

## 2021-10-30 ENCOUNTER — Ambulatory Visit: Payer: BC Managed Care – PPO | Admitting: Internal Medicine

## 2021-10-30 ENCOUNTER — Other Ambulatory Visit: Payer: Self-pay | Admitting: Internal Medicine

## 2021-10-30 DIAGNOSIS — G43839 Menstrual migraine, intractable, without status migrainosus: Secondary | ICD-10-CM

## 2021-10-30 MED ORDER — SUMATRIPTAN SUCCINATE 50 MG PO TABS: 50.0000 mg | ORAL_TABLET | ORAL | 5 refills | Status: DC | PRN

## 2021-10-30 MED ORDER — TOPIRAMATE 25 MG PO TABS: ORAL_TABLET | ORAL | 3 refills | Status: DC

## 2021-12-02 ENCOUNTER — Other Ambulatory Visit: Payer: Self-pay | Admitting: Internal Medicine

## 2021-12-03 NOTE — Telephone Encounter (Signed)
.   Requested Prescriptions  Pending Prescriptions Disp Refills  . simvastatin (ZOCOR) 10 MG tablet [Pharmacy Med Name: SIMVASTATIN 10MG  TABLETS] 90 tablet 0    Sig: TAKE 1 TABLET(10 MG) BY MOUTH AT BEDTIME     Cardiovascular:  Antilipid - Statins Failed - 12/02/2021  3:33 AM      Failed - Lipid Panel in normal range within the last 12 months    Cholesterol  Date Value Ref Range Status  07/18/2021 252 (H) <200 mg/dL Final   LDL Cholesterol (Calc)  Date Value Ref Range Status  07/18/2021 162 (H) mg/dL (calc) Final    Comment:    Reference range: <100 . Desirable range <100 mg/dL for primary prevention;   <70 mg/dL for patients with CHD or diabetic patients  with > or = 2 CHD risk factors. Marland Kitchen LDL-C is now calculated using the Martin-Hopkins  calculation, which is a validated novel method providing  better accuracy than the Friedewald equation in the  estimation of LDL-C.  Cresenciano Genre et al. Annamaria Helling. 7262;035(59): 2061-2068  (http://education.[facility].com/faq/FAQ164)    HDL  Date Value Ref Range Status  07/18/2021 59 > OR = 50 mg/dL Final   Triglycerides  Date Value Ref Range Status  07/18/2021 160 (H) <150 mg/dL Final         Passed - Patient is not pregnant      Passed - Valid encounter within last 12 months    Recent Outpatient Visits          4 months ago Encounter for general adult medical examination with abnormal findings   [facility] [place], Coralie Keens, NP   1 year ago Annual physical exam   [facility] [place], Coralie Keens, NP

## 2021-12-19 ENCOUNTER — Other Ambulatory Visit: Payer: Self-pay | Admitting: Internal Medicine

## 2021-12-19 ENCOUNTER — Encounter: Payer: Self-pay | Admitting: Internal Medicine

## 2021-12-19 NOTE — Telephone Encounter (Signed)
Unable to refill per protocol, request is too soon, last refill was 12/03/21 for 90.E-Prescribing Status: Receipt confirmed by pharmacy (12/03/2021 8:09 AM EDT). Will refuse.   Requested Prescriptions  Pending Prescriptions Disp Refills  . simvastatin (ZOCOR) 10 MG tablet [Pharmacy Med Name: SIMVASTATIN 10MG  TABLETS] 90 tablet 0    Sig: TAKE 1 TABLET(10 MG) BY MOUTH AT BEDTIME     Cardiovascular:  Antilipid - Statins Failed - 12/19/2021  8:18 AM      Failed - Lipid Panel in normal range within the last 12 months    Cholesterol  Date Value Ref Range Status  07/18/2021 252 (H) <200 mg/dL Final   LDL Cholesterol (Calc)  Date Value Ref Range Status  07/18/2021 162 (H) mg/dL (calc) Final    Comment:    Reference range: <100 . Desirable range <100 mg/dL for primary prevention;   <70 mg/dL for patients with CHD or diabetic patients  with > or = 2 CHD risk factors. Marland Kitchen LDL-C is now calculated using the Martin-Hopkins  calculation, which is a validated novel method providing  better accuracy than the Friedewald equation in the  estimation of LDL-C.  Cresenciano Genre et al. Annamaria Helling. 3149;702(63): 2061-2068  (http://education.[facility].com/faq/FAQ164)    HDL  Date Value Ref Range Status  07/18/2021 59 > OR = 50 mg/dL Final   Triglycerides  Date Value Ref Range Status  07/18/2021 160 (H) <150 mg/dL Final         Passed - Patient is not pregnant      Passed - Valid encounter within last 12 months    Recent Outpatient Visits          5 months ago Encounter for general adult medical examination with abnormal findings   [facility] [place], Coralie Keens, NP   1 year ago Annual physical exam   [facility] [place], Coralie Keens, NP

## 2021-12-20 MED ORDER — SUMATRIPTAN SUCCINATE 50 MG PO TABS: 50.0000 mg | ORAL_TABLET | ORAL | 5 refills | Status: DC | PRN

## 2022-01-02 ENCOUNTER — Other Ambulatory Visit: Payer: Self-pay | Admitting: Internal Medicine

## 2022-01-02 NOTE — Telephone Encounter (Signed)
Requested Prescriptions  Pending Prescriptions Disp Refills  . etonogestrel-ethinyl estradiol (NUVARING) 0.12-0.015 MG/24HR vaginal ring [Pharmacy Med Name: NUVARING] 3 each 0    Sig: INSERT 1 RING IN THE VAGINA, LEAVE IN PLACE FOR 3 CONSECUTIVE WEEKS, THEN REMOVE FOR 1 WEEK     OB/GYN:  Contraceptives Passed - 01/02/2022  8:29 AM      Passed - Last BP in normal range    BP Readings from Last 1 Encounters:  07/18/21 110/72         Passed - Valid encounter within last 12 months    Recent Outpatient Visits          5 months ago Encounter for general adult medical examination with abnormal findings   [facility] [place], Coralie Keens, NP   1 year ago Annual physical exam   [facility] [place], Coralie Keens, NP             Passed - Patient is not a smoker

## 2022-01-08 ENCOUNTER — Other Ambulatory Visit: Payer: Self-pay | Admitting: Internal Medicine

## 2022-01-09 NOTE — Telephone Encounter (Signed)
Requested Prescriptions  Pending Prescriptions Disp Refills  . escitalopram (LEXAPRO) 10 MG tablet [Pharmacy Med Name: ESCITALOPRAM 10MG  TABLETS] 90 tablet 0    Sig: TAKE 1 TABLET(10 MG) BY MOUTH DAILY     Psychiatry:  Antidepressants - SSRI Passed - 01/08/2022  8:08 AM      Passed - Completed PHQ-2 or PHQ-9 in the last 360 days      Passed - Valid encounter within last 6 months    Recent Outpatient Visits          5 months ago Encounter for general adult medical examination with abnormal findings   [facility] [place], Coralie Keens, NP   1 year ago Annual physical exam   [facility] [place], Coralie Keens, NP

## 2022-01-13 ENCOUNTER — Other Ambulatory Visit: Payer: Self-pay | Admitting: Internal Medicine

## 2022-01-13 MED ORDER — ESCITALOPRAM OXALATE 10 MG PO TABS: 10.0000 mg | ORAL_TABLET | Freq: Every day | ORAL | 0 refills | Status: DC

## 2022-02-25 ENCOUNTER — Ambulatory Visit: Payer: BC Managed Care – PPO | Admitting: Internal Medicine

## 2022-02-25 ENCOUNTER — Encounter: Payer: Self-pay | Admitting: Internal Medicine

## 2022-02-25 VITALS — BP 118/64 | HR 82 | Temp 97.1°F | Wt 135.0 lb

## 2022-02-25 DIAGNOSIS — Z23 Encounter for immunization: Secondary | ICD-10-CM

## 2022-02-25 DIAGNOSIS — G43839 Menstrual migraine, intractable, without status migrainosus: Secondary | ICD-10-CM

## 2022-02-25 DIAGNOSIS — F32A Depression, unspecified: Secondary | ICD-10-CM

## 2022-02-25 DIAGNOSIS — F419 Anxiety disorder, unspecified: Secondary | ICD-10-CM | POA: Diagnosis not present

## 2022-02-25 DIAGNOSIS — E782 Mixed hyperlipidemia: Secondary | ICD-10-CM | POA: Diagnosis not present

## 2022-02-25 DIAGNOSIS — L709 Acne, unspecified: Secondary | ICD-10-CM

## 2022-02-25 MED ORDER — TOPIRAMATE 50 MG PO TABS: 50.0000 mg | ORAL_TABLET | Freq: Two times a day (BID) | ORAL | 1 refills | Status: DC

## 2022-02-25 MED ORDER — SUMATRIPTAN SUCCINATE 50 MG PO TABS: 50.0000 mg | ORAL_TABLET | ORAL | 5 refills | Status: DC | PRN

## 2022-02-25 MED ORDER — ETONOGESTREL-ETHINYL ESTRADIOL 0.12-0.015 MG/24HR VA RING: VAGINAL_RING | VAGINAL | 1 refills | Status: DC

## 2022-02-25 NOTE — Assessment & Plan Note (Signed)
Deteriorated We will increase the Topamax to 50 mg daily Imitrex refilled today Try to identify triggers and avoid them

## 2022-02-25 NOTE — Assessment & Plan Note (Signed)
Managed without meds She does plan on following up with dermatology

## 2022-02-25 NOTE — Progress Notes (Signed)
Subjective:    Patient ID: Denise Jarvis, female    DOB: 05-20-2000, 21 y.o.   MRN: [id number]  HPI  Patient presents to clinic today for follow-up of chronic conditions.  Migraines: These occur a few times per week.  She is not sure what triggers this. She is taking Topamax and Imitrex as prescribed.  She does not follow with neurology.  Acne: She no longer takes Minocycline.  She is not currently following with dermatology.  HLD: Her last LDL was 162, triglycerides 160, 07/2021.  She is not taking Simvastatin as prescribed.  She does not consume a low-fat diet.  Anxiety and Depression: Chronic, managed on Escitalopram.  She is not currently seeing a therapist.  She denies SI/HI.  Review of Systems     Past Medical History:  Diagnosis Date   Family history of adverse reaction to anesthesia    mother has "severe PONV"   Frequent headaches     Current Outpatient Medications  Medication Sig Dispense Refill   escitalopram (LEXAPRO) 10 MG tablet Take 1 tablet (10 mg total) by mouth daily. 90 tablet 0   etonogestrel-ethinyl estradiol (NUVARING) 0.12-0.015 MG/24HR vaginal ring INSERT 1 RING IN THE VAGINA, LEAVE IN PLACE FOR 3 CONSECUTIVE WEEKS, THEN REMOVE FOR 1 WEEK 3 each 0   fluticasone (FLONASE) 50 MCG/ACT nasal spray SHAKE LIQUID AND USE 2 SPRAYS IN EACH NOSTRIL DAILY 16 g 6   minocycline (MINOCIN) 100 MG capsule TAKE 1 CAPSULE(100 MG) BY MOUTH TWICE DAILY 30 capsule 2   simvastatin (ZOCOR) 10 MG tablet TAKE 1 TABLET(10 MG) BY MOUTH AT BEDTIME 90 tablet 0   SUMAtriptan (IMITREX) 50 MG tablet Take 1 tablet (50 mg total) by mouth every 2 (two) hours as needed for migraine. May repeat in 2 hours if headache persists or recurs. 10 tablet 5   topiramate (TOPAMAX) 25 MG tablet TAKE 1 TABLET(25 MG) BY MOUTH AT BEDTIME 90 tablet 3   No current facility-administered medications for this visit.    No Known Allergies  Family History  Problem Relation Age of Onset   Hypertension Mother     Depression Mother        Post-Partum Depression   Migraines Neg Hx    Seizures Neg Hx    Anxiety disorder Neg Hx    Bipolar disorder Neg Hx    Schizophrenia Neg Hx    ADD / ADHD Neg Hx    Autism Neg Hx    Drug abuse Neg Hx    Suicidality Neg Hx     Social History   Socioeconomic History   Marital status: Single    Spouse name: Not on file   Number of children: Not on file   Years of education: Not on file   Highest education level: Not on file  Occupational History   Not on file  Tobacco Use   Smoking status: Never   Smokeless tobacco: Never  Vaping Use   Vaping Use: Never used  Substance and Sexual Activity   Alcohol use: No   Drug use: No   Sexual activity: Never  Other Topics Concern   Not on file  Social History Narrative   Naomy is a 10th grade student [employer]; does well in school. Juli is active in cross country, winter track and spring track.       Triva lives with her parents and with [name] her baby sister (dog).       No family history of learning disabilities.  No IEP or 504 plans in school.    Social Determinants of Health   Financial Resource Strain: Not on file  Food Insecurity: Not on file  Transportation Needs: Not on file  Physical Activity: Not on file  Stress: Not on file  Social Connections: Not on file  Intimate Partner Violence: Not on file     Constitutional: Patient reports intermittent headaches.  Denies fever, malaise, fatigue, or abrupt weight changes.  HEENT: Denies eye pain, eye redness, ear pain, ringing in the ears, wax buildup, runny nose, nasal congestion, bloody nose, or sore throat. Respiratory: Denies difficulty breathing, shortness of breath, cough or sputum production.   Cardiovascular: Denies chest pain, chest tightness, palpitations or swelling in the hands or feet.  Gastrointestinal: Denies abdominal pain, bloating, constipation, diarrhea or blood in the stool.  GU: Denies urgency,  frequency, pain with urination, burning sensation, blood in urine, odor or discharge. Musculoskeletal: Denies decrease in range of motion, difficulty with gait, muscle pain or joint pain and swelling.  Skin: Patient reports acne.  Denies redness, rashes, lesions or ulcercations.  Neurological: Denies dizziness, difficulty with memory, difficulty with speech or problems with balance and coordination.  Psych: Patient has a history of anxiety and depression.  Denies SI/HI.  No other specific complaints in a complete review of systems (except as listed in HPI above).  Objective:   Physical Exam BP 118/64 (BP Location: Left Arm, Patient Position: Sitting, Cuff Size: Normal)   Pulse 82   Temp (!) 97.1 F (36.2 C) (Temporal)   Wt 135 lb (61.2 kg)   SpO2 99%   BMI 24.11 kg/m   Wt Readings from Last 3 Encounters:  07/18/21 125 lb (56.7 kg)  10/16/20 130 lb (59 kg)  06/28/20 127 lb (57.6 kg) (48 %, Z= -0.05)*   * Growth percentiles are based on CDC (Girls, 2-20 Years) data.    General: Appears her stated age, well developed, well nourished in NAD. Skin: Warm, dry and intact. HEENT: Head: normal shape and size; Eyes: sclera white, no icterus, conjunctiva pink, PERRLA and EOMs intact;  Cardiovascular: Normal rate and rhythm. S1,S2 noted.  No murmur, rubs or gallops noted.  Pulmonary/Chest: Normal effort and positive vesicular breath sounds. No respiratory distress. No wheezes, rales or ronchi noted.  Musculoskeletal:  No difficulty with gait.  Neurological: Alert and oriented. Coordination normal.  Psychiatric: Mood and affect normal. Behavior is normal. Judgment and thought content normal.    BMET    Component Value Date/Time   NA 140 07/18/2021 0928   K 3.6 07/18/2021 0928   CL 105 07/18/2021 0928   CO2 24 07/18/2021 0928   GLUCOSE 94 07/18/2021 0928   BUN 11 07/18/2021 0928   CREATININE 0.81 07/18/2021 0928   CALCIUM 10.0 07/18/2021 0928    Lipid Panel     Component Value  Date/Time   CHOL 252 (H) 07/18/2021 0928   TRIG 160 (H) 07/18/2021 0928   HDL 59 07/18/2021 0928   CHOLHDL 4.3 07/18/2021 0928   VLDL 35.8 09/01/2019 1205   LDLCALC 162 (H) 07/18/2021 0928    CBC    Component Value Date/Time   WBC 7.1 07/18/2021 0928   RBC 4.80 07/18/2021 0928   HGB 14.7 07/18/2021 0928   HCT 43.7 07/18/2021 0928   PLT 229 07/18/2021 0928   MCV 91.0 07/18/2021 0928   MCH 30.6 07/18/2021 0928   MCHC 33.6 07/18/2021 0928   RDW 13.4 07/18/2021 0928   LYMPHSABS 3.2 12/06/2015 1324  MONOABS 0.8 12/06/2015 1324   EOSABS 0.1 12/06/2015 1324   BASOSABS 0.0 12/06/2015 1324    Hgb A1C Lab Results  Component Value Date   HGBA1C 5.3 07/18/2021            Assessment & Plan:      RTC in 6 months for annual exam Nicki Reaper, NP

## 2022-02-25 NOTE — Assessment & Plan Note (Signed)
C-Met and lipid profile today Noncompliant with statin therapy Encouraged her to consume a low-fat diet

## 2022-02-25 NOTE — Assessment & Plan Note (Signed)
Stable on her current dose of escitalopram Support offered 

## 2022-02-25 NOTE — Patient Instructions (Signed)

## 2022-02-25 NOTE — Addendum Note (Signed)
Addended by: Kavin Leech E on: 02/25/2022 09:44 AM   Modules accepted: Orders

## 2022-02-26 LAB — COMPLETE METABOLIC PANEL WITH GFR
AG Ratio: 1.8 (calc) (ref 1.0–2.5)
ALT: 9 U/L (ref 6–29)
AST: 10 U/L (ref 10–30)
Albumin: 4.3 g/dL (ref 3.6–5.1)
Alkaline phosphatase (APISO): 52 U/L (ref 31–125)
BUN: 9 mg/dL (ref 7–25)
CO2: 21 mmol/L (ref 20–32)
Calcium: 9.3 mg/dL (ref 8.6–10.2)
Chloride: 107 mmol/L (ref 98–110)
Creat: 0.79 mg/dL (ref 0.50–0.96)
Globulin: 2.4 g/dL (calc) (ref 1.9–3.7)
Glucose, Bld: 89 mg/dL (ref 65–99)
Potassium: 3.9 mmol/L (ref 3.5–5.3)
Sodium: 139 mmol/L (ref 135–146)
Total Bilirubin: 0.3 mg/dL (ref 0.2–1.2)
Total Protein: 6.7 g/dL (ref 6.1–8.1)
eGFR: 109 mL/min/{1.73_m2} (ref >=60)

## 2022-02-26 LAB — LIPID PANEL
Cholesterol: 257 mg/dL — ABNORMAL HIGH (ref <200)
HDL: 64 mg/dL (ref >=50)
LDL Cholesterol (Calc): 154 mg/dL (calc) — ABNORMAL HIGH
Non-HDL Cholesterol (Calc): 193 mg/dL (calc) — ABNORMAL HIGH (ref <130)
Total CHOL/HDL Ratio: 4 (calc) (ref <5.0)
Triglycerides: 229 mg/dL — ABNORMAL HIGH (ref <150)

## 2022-02-27 ENCOUNTER — Encounter: Payer: Self-pay | Admitting: Internal Medicine

## 2022-02-27 DIAGNOSIS — E7800 Pure hypercholesterolemia, unspecified: Secondary | ICD-10-CM

## 2022-02-27 MED ORDER — LOVASTATIN 10 MG PO TABS: 10.0000 mg | ORAL_TABLET | Freq: Every day | ORAL | 1 refills | Status: DC

## 2022-03-07 ENCOUNTER — Encounter: Payer: Self-pay | Admitting: Internal Medicine

## 2022-03-07 DIAGNOSIS — G43909 Migraine, unspecified, not intractable, without status migrainosus: Secondary | ICD-10-CM

## 2022-03-11 MED ORDER — NURTEC 75 MG PO TBDP: 75.0000 mg | ORAL_TABLET | ORAL | 2 refills | Status: DC

## 2022-03-11 NOTE — Addendum Note (Signed)
Addended by: Lorre Munroe on: 03/11/2022 10:17 AM   Modules accepted: Orders

## 2022-03-12 ENCOUNTER — Other Ambulatory Visit: Payer: Self-pay | Admitting: Internal Medicine

## 2022-03-14 NOTE — Telephone Encounter (Signed)
D/C 02/27/22.

## 2022-04-09 ENCOUNTER — Encounter: Payer: Self-pay | Admitting: Internal Medicine

## 2022-04-09 MED ORDER — SUMATRIPTAN SUCCINATE 50 MG PO TABS: 50.0000 mg | ORAL_TABLET | ORAL | 5 refills | Status: DC | PRN

## 2022-04-09 NOTE — Telephone Encounter (Signed)
Can you check on what happened with the Nurtec PA

## 2022-04-11 ENCOUNTER — Other Ambulatory Visit: Payer: Self-pay | Admitting: Internal Medicine

## 2022-04-11 NOTE — Telephone Encounter (Signed)
Requested Prescriptions  Pending Prescriptions Disp Refills   escitalopram (LEXAPRO) 10 MG tablet [Pharmacy Med Name: ESCITALOPRAM 10MG  TABLETS] 90 tablet 1    Sig: TAKE 1 TABLET(10 MG) BY MOUTH DAILY     Psychiatry:  Antidepressants - SSRI Passed - 04/11/2022  8:07 AM      Passed - Completed PHQ-2 or PHQ-9 in the last 360 days      Passed - Valid encounter within last 6 months    Recent Outpatient Visits           1 month ago Mixed hyperlipidemia   [facility] [place], Coralie Keens, NP   8 months ago Encounter for general adult medical examination with abnormal findings   [facility] [place], Coralie Keens, NP   1 year ago Annual physical exam   [facility] [place], Coralie Keens, NP       Future Appointments             In 4 months Baity, Coralie Keens, NP [facility], [facility]

## 2022-05-25 ENCOUNTER — Other Ambulatory Visit: Payer: Self-pay | Admitting: Internal Medicine

## 2022-05-26 ENCOUNTER — Other Ambulatory Visit: Payer: Self-pay | Admitting: Internal Medicine

## 2022-05-26 NOTE — Telephone Encounter (Signed)
Future appt in 3 months.  Requested Prescriptions  Pending Prescriptions Disp Refills   topiramate (TOPAMAX) 50 MG tablet [Pharmacy Med Name: TOPIRAMATE '50MG'$  TABLETS] 90 tablet 0    Sig: TAKE 1 TABLET(50 MG) BY MOUTH TWICE DAILY     Neurology: Anticonvulsants - topiramate & zonisamide Passed - 05/25/2022 10:17 AM      Passed - Cr in normal range and within 360 days    Creat  Date Value Ref Range Status  02/25/2022 0.79 0.50 - 0.96 mg/dL Final         Passed - CO2 in normal range and within 360 days    CO2  Date Value Ref Range Status  02/25/2022 21 20 - 32 mmol/L Final         Passed - ALT in normal range and within 360 days    ALT  Date Value Ref Range Status  02/25/2022 9 6 - 29 U/L Final         Passed - AST in normal range and within 360 days    AST  Date Value Ref Range Status  02/25/2022 10 10 - 30 U/L Final         Passed - Completed PHQ-2 or PHQ-9 in the last 360 days      Passed - Valid encounter within last 12 months    Recent Outpatient Visits           3 months ago Mixed hyperlipidemia   [place], Coralie Keens, NP   10 months ago Encounter for general adult medical examination with abnormal findings   [facility] [place], Coralie Keens, NP   1 year ago Annual physical exam   [facility] [place], Coralie Keens, NP       Future Appointments             In 3 months Baity, Coralie Keens, NP [facility], [facility]

## 2022-05-27 NOTE — Telephone Encounter (Signed)
Refilled 05/26/22 #90.

## 2022-08-27 ENCOUNTER — Encounter: Payer: Self-pay | Admitting: Internal Medicine

## 2022-08-27 ENCOUNTER — Ambulatory Visit (INDEPENDENT_AMBULATORY_CARE_PROVIDER_SITE_OTHER): Payer: BC Managed Care – PPO | Admitting: Internal Medicine

## 2022-08-27 ENCOUNTER — Other Ambulatory Visit (HOSPITAL_COMMUNITY)
Admission: RE | Admit: 2022-08-27 | Discharge: 2022-08-27 | Disposition: A | Discharge status: Home / Self Care | Payer: BC Managed Care – PPO | Source: Ambulatory Visit | Attending: Internal Medicine | Admitting: Internal Medicine

## 2022-08-27 VITALS — BP 110/74 | HR 65 | Temp 95.3°F | Ht 62.5 in | Wt 130.0 lb

## 2022-08-27 DIAGNOSIS — Z0001 Encounter for general adult medical examination with abnormal findings: Secondary | ICD-10-CM | POA: Diagnosis present

## 2022-08-27 DIAGNOSIS — E7800 Pure hypercholesterolemia, unspecified: Secondary | ICD-10-CM | POA: Diagnosis not present

## 2022-08-27 DIAGNOSIS — R7309 Other abnormal glucose: Secondary | ICD-10-CM | POA: Diagnosis not present

## 2022-08-27 DIAGNOSIS — Z124 Encounter for screening for malignant neoplasm of cervix: Secondary | ICD-10-CM | POA: Insufficient documentation

## 2022-08-27 DIAGNOSIS — G43839 Menstrual migraine, intractable, without status migrainosus: Secondary | ICD-10-CM

## 2022-08-27 MED ORDER — SUMATRIPTAN SUCCINATE 50 MG PO TABS: 50.0000 mg | ORAL_TABLET | ORAL | 5 refills | Status: DC | PRN

## 2022-08-27 NOTE — Progress Notes (Signed)
Subjective:    Patient ID: Denise Jarvis, female    DOB: 04-06-2000, 22 y.o.   MRN: [id number]  HPI  Patient presents to clinic today for her annual exam.  Flu: never Tetanus: 09/2019 COVID: never Pap smear: never Dentist: biannually  Diet: She does eat meat. She consumes fruits and veggies. She does eat some fried foods. She drinks mostly water. Exercise: Walking  Review of Systems     Past Medical History:  Diagnosis Date   Family history of adverse reaction to anesthesia    mother has "severe PONV"   Frequent headaches     Current Outpatient Medications  Medication Sig Dispense Refill   escitalopram (LEXAPRO) 10 MG tablet TAKE 1 TABLET(10 MG) BY MOUTH DAILY 90 tablet 1   etonogestrel-ethinyl estradiol (NUVARING) 0.12-0.015 MG/24HR vaginal ring INSERT 1 RING IN THE VAGINA, LEAVE IN PLACE FOR 3 CONSECUTIVE WEEKS, THEN REMOVE FOR 1 WEEK 3 each 1   fluticasone (FLONASE) 50 MCG/ACT nasal spray SHAKE LIQUID AND USE 2 SPRAYS IN EACH NOSTRIL DAILY 16 g 6   lovastatin (MEVACOR) 10 MG tablet Take 1 tablet (10 mg total) by mouth at bedtime. 90 tablet 1   NURTEC 75 MG TBDP Take 75 mg by mouth every other day. For headache prevention. If develop headache on day between dose may take on that day and skip next. Max 1 tablet in 24 hours. 16 tablet 2   SUMAtriptan (IMITREX) 50 MG tablet Take 1 tablet (50 mg total) by mouth every 2 (two) hours as needed for migraine. May repeat in 2 hours if headache persists or recurs. 10 tablet 5   topiramate (TOPAMAX) 50 MG tablet TAKE 1 TABLET(50 MG) BY MOUTH TWICE DAILY 90 tablet 0   No current facility-administered medications for this visit.    No Known Allergies  Family History  Problem Relation Age of Onset   Hypertension Mother    Depression Mother        Post-Partum Depression   Migraines Neg Hx    Seizures Neg Hx    Anxiety disorder Neg Hx    Bipolar disorder Neg Hx    Schizophrenia Neg Hx    ADD / ADHD Neg Hx    Autism Neg Hx     Drug abuse Neg Hx    Suicidality Neg Hx     Social History   Socioeconomic History   Marital status: Single    Spouse name: Not on file   Number of children: Not on file   Years of education: Not on file   Highest education level: Not on file  Occupational History   Not on file  Tobacco Use   Smoking status: Never   Smokeless tobacco: Never  Vaping Use   Vaping Use: Never used  Substance and Sexual Activity   Alcohol use: No   Drug use: No   Sexual activity: Never  Other Topics Concern   Not on file  Social History Narrative   Zyonna is a 10th grade student [employer]; does well in school. Alicia is active in cross country, winter track and spring track.       Tekeisha lives with her parents and with [name] her baby sister (dog).       No family history of learning disabilities.    No IEP or 504 plans in school.    Social Determinants of Health   Financial Resource Strain: Not on file  Food Insecurity: Not on file  Transportation Needs: Not on  file  Physical Activity: Not on file  Stress: Not on file  Social Connections: Not on file  Intimate Partner Violence: Not on file     Constitutional: Patient reports intermittent headaches.  Denies fever, malaise, fatigue, or abrupt weight changes.  HEENT: Denies eye pain, eye redness, ear pain, ringing in the ears, wax buildup, runny nose, nasal congestion, bloody nose, or sore throat. Respiratory: Denies difficulty breathing, shortness of breath, cough or sputum production.   Cardiovascular: Denies chest pain, chest tightness, palpitations or swelling in the hands or feet.  Gastrointestinal: Denies abdominal pain, bloating, constipation, diarrhea or blood in the stool.  GU: Denies urgency, frequency, pain with urination, burning sensation, blood in urine, odor or discharge. Musculoskeletal: Denies decrease in range of motion, difficulty with gait, muscle pain or joint pain and swelling.  Skin: Denies redness,  rashes, lesions or ulcercations.  Neurological: Denies dizziness, difficulty with memory, difficulty with speech or problems with balance and coordination.  Psych: Patient has a history of anxiety and depression.  Denies SI/HI.  No other specific complaints in a complete review of systems (except as listed in HPI above).  Objective:   Physical Exam BP 110/74 (BP Location: Right Arm, Patient Position: Sitting, Cuff Size: Normal)   Pulse 65   Temp (!) 95.3 F (35.2 C) (Temporal)   Ht 5' 2.5" (1.588 m)   Wt 130 lb (59 kg)   SpO2 99%   BMI 23.40 kg/m   Wt Readings from Last 3 Encounters:  02/25/22 135 lb (61.2 kg)  07/18/21 125 lb (56.7 kg)  10/16/20 130 lb (59 kg)    General: Appears her stated age, well developed, well nourished in NAD. Skin: Warm, dry and intact.  HEENT: Head: normal shape and size; Eyes: sclera white, no icterus, conjunctiva pink, PERRLA and EOMs intact;  Neck:  Neck supple, trachea midline. No masses, lumps or thyromegaly present.  Cardiovascular: Normal rate and rhythm. S1,S2 noted.  No murmur, rubs or gallops noted. No JVD or BLE edema.  Pulmonary/Chest: Normal effort and positive vesicular breath sounds. No respiratory distress. No wheezes, rales or ronchi noted.  Abdomen: Soft and nontender. Normal bowel sounds.  Pelvic: Normal female anatomy.  Cervix without mass or lesion.  Small amount of bloody discharge noted.  No CMT.  Adnexa nonpalpable. Musculoskeletal: Strength 5/5 BUE/BLE No difficulty with gait.  Neurological: Alert and oriented. Cranial nerves II-XII grossly intact. Coordination normal.  Psychiatric: Mood and affect normal. Behavior is normal. Judgment and thought content normal.     BMET    Component Value Date/Time   NA 139 02/25/2022 0940   K 3.9 02/25/2022 0940   CL 107 02/25/2022 0940   CO2 21 02/25/2022 0940   GLUCOSE 89 02/25/2022 0940   BUN 9 02/25/2022 0940   CREATININE 0.79 02/25/2022 0940   CALCIUM 9.3 02/25/2022 0940     Lipid Panel     Component Value Date/Time   CHOL 257 (H) 02/25/2022 0940   TRIG 229 (H) 02/25/2022 0940   HDL 64 02/25/2022 0940   CHOLHDL 4.0 02/25/2022 0940   VLDL 35.8 09/01/2019 1205   LDLCALC 154 (H) 02/25/2022 0940    CBC    Component Value Date/Time   WBC 7.1 07/18/2021 0928   RBC 4.80 07/18/2021 0928   HGB 14.7 07/18/2021 0928   HCT 43.7 07/18/2021 0928   PLT 229 07/18/2021 0928   MCV 91.0 07/18/2021 0928   MCH 30.6 07/18/2021 0928   MCHC 33.6 07/18/2021 0928  RDW 13.4 07/18/2021 0928   LYMPHSABS 3.2 12/06/2015 1324   MONOABS 0.8 12/06/2015 1324   EOSABS 0.1 12/06/2015 1324   BASOSABS 0.0 12/06/2015 1324    Hgb A1C Lab Results  Component Value Date   HGBA1C 5.3 07/18/2021             Assessment & Plan:   Preventative Health Maintenance:  Encouraged her to get a flu shot in the fall Tetanus UTD Encouraged her to get her COVID-vaccine Pap smear today-she declines STD screening Encouraged her to consume a balanced diet and exercise regimen Advised her to see a dentist annually We will check CBC, c-Met, lipid, A1c today  RTC in 6 months, follow-up chronic conditions Nicki Reaper, NP

## 2022-08-27 NOTE — Patient Instructions (Signed)

## 2022-08-28 LAB — COMPLETE METABOLIC PANEL WITH GFR
AG Ratio: 1.9 (calc) (ref 1.0–2.5)
ALT: 13 U/L (ref 6–29)
AST: 17 U/L (ref 10–30)
Albumin: 4.3 g/dL (ref 3.6–5.1)
Alkaline phosphatase (APISO): 62 U/L (ref 31–125)
BUN: 14 mg/dL (ref 7–25)
CO2: 24 mmol/L (ref 20–32)
Calcium: 9.4 mg/dL (ref 8.6–10.2)
Chloride: 107 mmol/L (ref 98–110)
Creat: 0.9 mg/dL (ref 0.50–0.96)
Globulin: 2.3 g/dL (calc) (ref 1.9–3.7)
Glucose, Bld: 87 mg/dL (ref 65–99)
Potassium: 4.1 mmol/L (ref 3.5–5.3)
Sodium: 139 mmol/L (ref 135–146)
Total Bilirubin: 0.4 mg/dL (ref 0.2–1.2)
Total Protein: 6.6 g/dL (ref 6.1–8.1)
eGFR: 93 mL/min/{1.73_m2} (ref >=60)

## 2022-08-28 LAB — CBC
HCT: 43.4 % (ref 35.0–45.0)
Hemoglobin: 14.3 g/dL (ref 11.7–15.5)
MCH: 30.4 pg (ref 27.0–33.0)
MCHC: 32.9 g/dL (ref 32.0–36.0)
MCV: 92.1 fL (ref 80.0–100.0)
MPV: 10.2 fL (ref 7.5–12.5)
Platelets: 236 10*3/uL (ref 140–400)
RBC: 4.71 10*6/uL (ref 3.80–5.10)
RDW: 12.1 % (ref 11.0–15.0)
WBC: 8.6 10*3/uL (ref 3.8–10.8)

## 2022-08-28 LAB — HEMOGLOBIN A1C
Hgb A1c MFr Bld: 5.3 % of total Hgb (ref <5.7)
Mean Plasma Glucose: 105 mg/dL
eAG (mmol/L): 5.8 mmol/L

## 2022-08-28 LAB — LIPID PANEL
Cholesterol: 192 mg/dL (ref <200)
HDL: 64 mg/dL (ref >=50)
LDL Cholesterol (Calc): 111 mg/dL (calc) — ABNORMAL HIGH
Non-HDL Cholesterol (Calc): 128 mg/dL (calc) (ref <130)
Total CHOL/HDL Ratio: 3 (calc) (ref <5.0)
Triglycerides: 76 mg/dL (ref <150)

## 2022-08-29 LAB — CYTOLOGY - PAP: Diagnosis: NEGATIVE

## 2022-08-30 ENCOUNTER — Encounter: Payer: Self-pay | Admitting: Internal Medicine

## 2022-09-01 MED ORDER — LOVASTATIN 20 MG PO TABS: 20.0000 mg | ORAL_TABLET | Freq: Every day | ORAL | 3 refills | Status: DC

## 2022-09-04 ENCOUNTER — Other Ambulatory Visit: Payer: Self-pay | Admitting: Internal Medicine

## 2022-09-04 NOTE — Telephone Encounter (Signed)
Requested Prescriptions  Pending Prescriptions Disp Refills   etonogestrel-ethinyl estradiol (NUVARING) 0.12-0.015 MG/24HR vaginal ring [Pharmacy Med Name: NUVARING] 3 each 0    Sig: INSERT 1 RING VAGINALLY FOR 3 WEEKS THEN REMOVE FOR 1 WEEK     OB/GYN:  Contraceptives Passed - 09/04/2022  3:32 AM      Passed - Last BP in normal range    BP Readings from Last 1 Encounters:  08/27/22 110/74         Passed - Valid encounter within last 12 months    Recent Outpatient Visits           1 week ago Encounter for general adult medical examination with abnormal findings   [place] [place], Salvadore Oxford, NP   6 months ago Mixed hyperlipidemia   [place] [facility] [place], Salvadore Oxford, NP   1 year ago Encounter for general adult medical examination with abnormal findings   [place] [facility] [place], Salvadore Oxford, NP   1 year ago Annual physical exam   [place] [facility] [place], Salvadore Oxford, NP       Future Appointments             In 6 months Baity, Salvadore Oxford, NP [place] [facility], [facility]            Passed - Patient is not a smoker

## 2022-09-09 ENCOUNTER — Other Ambulatory Visit: Payer: Self-pay | Admitting: Internal Medicine

## 2022-09-09 NOTE — Telephone Encounter (Signed)
Requested Prescriptions  Refused Prescriptions Disp Refills   lovastatin (MEVACOR) 10 MG tablet [Pharmacy Med Name: LOVASTATIN 10MG  TABLETS] 90 tablet 1    Sig: TAKE 1 TABLET(10 MG) BY MOUTH AT BEDTIME     Cardiovascular:  Antilipid - Statins 2 Failed - 09/09/2022  3:32 AM      Failed - Lipid Panel in normal range within the last 12 months    Cholesterol  Date Value Ref Range Status  08/27/2022 192 <200 mg/dL Final   LDL Cholesterol (Calc)  Date Value Ref Range Status  08/27/2022 111 (H) mg/dL (calc) Final    Comment:    Reference range: <100 . Desirable range <100 mg/dL for primary prevention;   <70 mg/dL for patients with CHD or diabetic patients  with > or = 2 CHD risk factors. Marland Kitchen LDL-C is now calculated using the Martin-Hopkins  calculation, which is a validated novel method providing  better accuracy than the Friedewald equation in the  estimation of LDL-C.  Horald Pollen et al. Lenox Ahr. 4098;119(14): 2061-2068  (http://education.[employer].com/faq/FAQ164)    HDL  Date Value Ref Range Status  08/27/2022 64 > OR = 50 mg/dL Final   Triglycerides  Date Value Ref Range Status  08/27/2022 76 <150 mg/dL Final         Passed - Cr in normal range and within 360 days    Creat  Date Value Ref Range Status  08/27/2022 0.90 0.50 - 0.96 mg/dL Final         Passed - Patient is not pregnant      Passed - Valid encounter within last 12 months    Recent Outpatient Visits           1 week ago Encounter for general adult medical examination with abnormal findings   [place] [facility] [place], Salvadore Oxford, NP   6 months ago Mixed hyperlipidemia   [place] [facility] [place], Salvadore Oxford, NP   1 year ago Encounter for general adult medical examination with abnormal findings   [place] [facility], Salvadore Oxford, NP   1 year ago Annual physical exam   [place] [facility] [place], Salvadore Oxford, NP       Future  Appointments             In 5 months Baity, Salvadore Oxford, NP [place] [facility], [facility]

## 2022-09-21 ENCOUNTER — Other Ambulatory Visit: Payer: Self-pay | Admitting: Internal Medicine

## 2022-09-21 DIAGNOSIS — G43839 Menstrual migraine, intractable, without status migrainosus: Secondary | ICD-10-CM

## 2022-09-22 NOTE — Telephone Encounter (Signed)
Requested Prescriptions  Pending Prescriptions Disp Refills   topiramate (TOPAMAX) 25 MG tablet [Pharmacy Med Name: TOPIRAMATE 25MG  TABLETS] 90 tablet 3    Sig: TAKE 1 TABLET(25 MG) BY MOUTH AT BEDTIME     Neurology: Anticonvulsants - topiramate & zonisamide Passed - 09/21/2022  3:33 AM      Passed - Cr in normal range and within 360 days    Creat  Date Value Ref Range Status  08/27/2022 0.90 0.50 - 0.96 mg/dL Final         Passed - CO2 in normal range and within 360 days    CO2  Date Value Ref Range Status  08/27/2022 24 20 - 32 mmol/L Final         Passed - ALT in normal range and within 360 days    ALT  Date Value Ref Range Status  08/27/2022 13 6 - 29 U/L Final         Passed - AST in normal range and within 360 days    AST  Date Value Ref Range Status  08/27/2022 17 10 - 30 U/L Final         Passed - Completed PHQ-2 or PHQ-9 in the last 360 days      Passed - Valid encounter within last 12 months    Recent Outpatient Visits           3 weeks ago Encounter for general adult medical examination with abnormal findings   [facility] [facility] [place], Salvadore Oxford, NP   6 months ago Mixed hyperlipidemia   [place] [facility] [place], Salvadore Oxford, NP   1 year ago Encounter for general adult medical examination with abnormal findings   [place] [place], Salvadore Oxford, NP   1 year ago Annual physical exam   [place] [facility] [place], Salvadore Oxford, NP       Future Appointments             In 5 months Baity, Salvadore Oxford, NP [facility], [facility]

## 2022-10-17 ENCOUNTER — Telehealth: Payer: Self-pay | Admitting: Internal Medicine

## 2022-10-17 ENCOUNTER — Other Ambulatory Visit: Payer: Self-pay | Admitting: Internal Medicine

## 2022-10-17 NOTE — Telephone Encounter (Signed)
Patient called stated she does not want to be going through withdrawal over the weekend so she needs the escitalopram (LEXAPRO) 10 MG tablet filled today so she can pick it up from the pharmacy. Please f/u with patient

## 2022-10-17 NOTE — Telephone Encounter (Signed)
Pt forgot to call in her RX and is completely out of her  escitalopram (LEXAPRO) 10 MG tablet  Pt knows she cannot go w/out this med, especially over the weekend, but she thought she had refills.

## 2022-10-20 NOTE — Telephone Encounter (Signed)
Requested Prescriptions  Pending Prescriptions Disp Refills   escitalopram (LEXAPRO) 10 MG tablet [Pharmacy Med Name: ESCITALOPRAM 10MG  TABLETS] 90 tablet 1    Sig: TAKE 1 TABLET(10 MG) BY MOUTH DAILY     Psychiatry:  Antidepressants - SSRI Passed - 10/17/2022  3:14 PM      Passed - Completed PHQ-2 or PHQ-9 in the last 360 days      Passed - Valid encounter within last 6 months    Recent Outpatient Visits           1 month ago Encounter for general adult medical examination with abnormal findings   [place] [facility] [place], Salvadore Oxford, NP   7 months ago Mixed hyperlipidemia   [facility] [place], Salvadore Oxford, NP   1 year ago Encounter for general adult medical examination with abnormal findings   [place] [facility] [place], Salvadore Oxford, NP   2 years ago Annual physical exam   [place] [facility] [place], Salvadore Oxford, NP       Future Appointments             In 4 months Baity, Salvadore Oxford, NP [place] [facility], [facility]

## 2022-11-27 ENCOUNTER — Other Ambulatory Visit: Payer: Self-pay | Admitting: Internal Medicine

## 2022-11-27 NOTE — Telephone Encounter (Signed)
Requested Prescriptions  Pending Prescriptions Disp Refills   etonogestrel-ethinyl estradiol (NUVARING) 0.12-0.015 MG/24HR vaginal ring [Pharmacy Med Name: NUVARING] 3 each 1    Sig: INSERT 1 RING VAGINALLY AND LEAVE IN PLACE FOR 3 CONSECUTIVE WEEKS, THEN REMOVE FOR 1 WEEK     OB/GYN:  Contraceptives Passed - 11/27/2022  3:33 AM      Passed - Last BP in normal range    BP Readings from Last 1 Encounters:  08/27/22 110/74         Passed - Valid encounter within last 12 months    Recent Outpatient Visits           3 months ago Encounter for general adult medical examination with abnormal findings   [place] [facility] [place], Salvadore Oxford, NP   9 months ago Mixed hyperlipidemia   [place] [facility] [place], Salvadore Oxford, NP   1 year ago Encounter for general adult medical examination with abnormal findings   [place] [place], Salvadore Oxford, NP   2 years ago Annual physical exam   [place] [facility] [place], Salvadore Oxford, NP       Future Appointments             In 3 months Baity, Salvadore Oxford, NP [place] [facility], [facility]            Passed - Patient is not a smoker

## 2022-11-28 ENCOUNTER — Encounter: Payer: Self-pay | Admitting: Internal Medicine

## 2022-11-28 MED ORDER — SUMATRIPTAN SUCCINATE 50 MG PO TABS: 50.0000 mg | ORAL_TABLET | ORAL | 5 refills | Status: DC | PRN

## 2023-01-03 ENCOUNTER — Other Ambulatory Visit: Payer: Self-pay | Admitting: Internal Medicine

## 2023-01-03 DIAGNOSIS — G43839 Menstrual migraine, intractable, without status migrainosus: Secondary | ICD-10-CM

## 2023-01-05 NOTE — Telephone Encounter (Signed)
D/C 02/25/22.  Requested Prescriptions  Refused Prescriptions Disp Refills   topiramate (TOPAMAX) 25 MG tablet [Pharmacy Med Name: TOPIRAMATE 25MG  TABLETS] 90 tablet 3    Sig: TAKE 1 TABLET(25 MG) BY MOUTH AT BEDTIME     Neurology: Anticonvulsants - topiramate & zonisamide Passed - 01/03/2023 10:47 AM      Passed - Cr in normal range and within 360 days    Creat  Date Value Ref Range Status  08/27/2022 0.90 0.50 - 0.96 mg/dL Final         Passed - CO2 in normal range and within 360 days    CO2  Date Value Ref Range Status  08/27/2022 24 20 - 32 mmol/L Final         Passed - ALT in normal range and within 360 days    ALT  Date Value Ref Range Status  08/27/2022 13 6 - 29 U/L Final         Passed - AST in normal range and within 360 days    AST  Date Value Ref Range Status  08/27/2022 17 10 - 30 U/L Final         Passed - Completed PHQ-2 or PHQ-9 in the last 360 days      Passed - Valid encounter within last 12 months    Recent Outpatient Visits           4 months ago Encounter for general adult medical examination with abnormal findings   [place] [facility] [place], Salvadore Oxford, NP   10 months ago Mixed hyperlipidemia   [place] [facility] [place], Salvadore Oxford, NP   1 year ago Encounter for general adult medical examination with abnormal findings   [place] [facility] [place], Salvadore Oxford, NP   2 years ago Annual physical exam   [place] [facility] [place], Salvadore Oxford, NP       Future Appointments             In 1 month Baity, Salvadore Oxford, NP [place] [facility], [facility]

## 2023-02-02 ENCOUNTER — Telehealth (INDEPENDENT_AMBULATORY_CARE_PROVIDER_SITE_OTHER): Payer: Self-pay | Admitting: Internal Medicine

## 2023-02-02 ENCOUNTER — Encounter: Payer: Self-pay | Admitting: Internal Medicine

## 2023-02-02 DIAGNOSIS — J069 Acute upper respiratory infection, unspecified: Secondary | ICD-10-CM

## 2023-02-02 DIAGNOSIS — R058 Other specified cough: Secondary | ICD-10-CM

## 2023-02-02 MED ORDER — BENZONATATE 200 MG PO CAPS: 200.0000 mg | ORAL_CAPSULE | Freq: Three times a day (TID) | ORAL | 0 refills | Status: DC | PRN

## 2023-02-02 MED ORDER — PREDNISONE 10 MG PO TABS: ORAL_TABLET | ORAL | 0 refills | Status: DC

## 2023-02-02 NOTE — Patient Instructions (Signed)

## 2023-02-02 NOTE — Progress Notes (Signed)
Virtual Visit via Video Note  I connected with Denise Jarvis on 02/02/23 at  4:00 PM EST by a video enabled telemedicine application and verified that I am speaking with the correct person using two identifiers.  Location: Patient: Work Provider: Office  Persons participating in this video call: Denise Reaper, NP and Denise Jarvis   I discussed the limitations of evaluation and management by telemedicine and the availability of in person appointments. The patient expressed understanding and agreed to proceed.  History of Present Illness:   Discussed the use of AI scribe software for clinical note transcription with the patient, who gave verbal consent to proceed.  The patient, previously diagnosed with a lower respiratory infection, was treated with azithromycin, promethazine DM cough syrup, and an inhaler about a month ago. Despite this treatment, the patient reports a persistent dry cough. Recently, she has also developed facial congestion and a runny nose. She denies any associated symptoms such as headache, ear pain, or sore throat. The patient has not taken any additional medications beyond those prescribed. She denies experiencing fever, chills, nausea, or vomiting. The patient has used the inhaler sparingly, expressing a dislike for it.        Past Medical History:  Diagnosis Date   Family history of adverse reaction to anesthesia    mother has "severe PONV"   Frequent headaches     Current Outpatient Medications  Medication Sig Dispense Refill   escitalopram (LEXAPRO) 10 MG tablet TAKE 1 TABLET(10 MG) BY MOUTH DAILY 90 tablet 1   etonogestrel-ethinyl estradiol (NUVARING) 0.12-0.015 MG/24HR vaginal ring INSERT 1 RING VAGINALLY AND LEAVE IN PLACE FOR 3 CONSECUTIVE WEEKS, THEN REMOVE FOR 1 WEEK 3 each 1   fluticasone (FLONASE) 50 MCG/ACT nasal spray SHAKE LIQUID AND USE 2 SPRAYS IN EACH NOSTRIL DAILY (Patient not taking: Reported on 08/27/2022) 16 g 6   lovastatin (MEVACOR) 20  MG tablet Take 1 tablet (20 mg total) by mouth at bedtime. 90 tablet 3   NURTEC 75 MG TBDP Take 75 mg by mouth every other day. For headache prevention. If develop headache on day between dose may take on that day and skip next. Max 1 tablet in 24 hours. 16 tablet 2   SUMAtriptan (IMITREX) 50 MG tablet Take 1 tablet (50 mg total) by mouth every 2 (two) hours as needed for migraine. May repeat in 2 hours if headache persists or recurs. 10 tablet 5   topiramate (TOPAMAX) 50 MG tablet TAKE 1 TABLET(50 MG) BY MOUTH TWICE DAILY 90 tablet 0   No current facility-administered medications for this visit.    No Known Allergies  Family History  Problem Relation Age of Onset   Hypertension Mother    Depression Mother        Post-Partum Depression   Healthy Father    Migraines Neg Hx    Seizures Neg Hx    Anxiety disorder Neg Hx    Bipolar disorder Neg Hx    Schizophrenia Neg Hx    ADD / ADHD Neg Hx    Autism Neg Hx    Drug abuse Neg Hx    Suicidality Neg Hx     Social History   Socioeconomic History   Marital status: Single    Spouse name: Not on file   Number of children: Not on file   Years of education: Not on file   Highest education level: Not on file  Occupational History   Not on file  Tobacco Use   Smoking  status: Never   Smokeless tobacco: Never  Vaping Use   Vaping status: Never Used  Substance and Sexual Activity   Alcohol use: No   Drug use: No   Sexual activity: Never  Other Topics Concern   Not on file  Social History Narrative   Denise Jarvis is a 10th grade student [employer]; does well in school. Denise Jarvis is active in cross country, winter track and spring track.       Denise Jarvis lives with her parents and with Denise Jarvis her baby sister (dog).       No family history of learning disabilities.    No IEP or 504 plans in school.    Social Determinants of Health   Financial Resource Strain: Not on file  Food Insecurity: Not on file  Transportation Needs:  Not on file  Physical Activity: Not on file  Stress: Not on file  Social Connections: Not on file  Intimate Partner Violence: Not on file     Constitutional: Denies fever, malaise, fatigue, headache or abrupt weight changes.  HEENT: Patient reports some pressure, runny nose.  Denies eye pain, eye redness, ear pain, ringing in the ears, wax buildup, nasal congestion, bloody nose, or sore throat. Respiratory: Patient reports cough.  Denies difficulty breathing, shortness of breath, or sputum production.   Cardiovascular: Denies chest pain, chest tightness, palpitations or swelling in the hands or feet.  Gastrointestinal: Denies abdominal pain, bloating, constipation, diarrhea or blood in the stool.  Psych: Denies anxiety, depression, SI/HI.  No other specific complaints in a complete review of systems (except as listed in HPI above).  Observations/Objective:  Wt Readings from Last 3 Encounters:  08/27/22 130 lb (59 kg)  02/25/22 135 lb (61.2 kg)  07/18/21 125 lb (56.7 kg)    General: Appears her stated age, well developed, well nourished in NAD. HEENT: Head: normal shape and size;  Nose: Slight congestion noted throat/Mouth: No hoarseness noted. Pulmonary/Chest: Normal effort. No respiratory distress.  Neurological: Alert and oriented.   BMET    Component Value Date/Time   NA 139 08/27/2022 0830   K 4.1 08/27/2022 0830   CL 107 08/27/2022 0830   CO2 24 08/27/2022 0830   GLUCOSE 87 08/27/2022 0830   BUN 14 08/27/2022 0830   CREATININE 0.90 08/27/2022 0830   CALCIUM 9.4 08/27/2022 0830    Lipid Panel     Component Value Date/Time   CHOL 192 08/27/2022 0830   TRIG 76 08/27/2022 0830   HDL 64 08/27/2022 0830   CHOLHDL 3.0 08/27/2022 0830   VLDL 35.8 09/01/2019 1205   LDLCALC 111 (H) 08/27/2022 0830    CBC    Component Value Date/Time   WBC 8.6 08/27/2022 0830   RBC 4.71 08/27/2022 0830   HGB 14.3 08/27/2022 0830   HCT 43.4 08/27/2022 0830   PLT 236 08/27/2022  0830   MCV 92.1 08/27/2022 0830   MCH 30.4 08/27/2022 0830   MCHC 32.9 08/27/2022 0830   RDW 12.1 08/27/2022 0830   LYMPHSABS 3.2 12/06/2015 1324   MONOABS 0.8 12/06/2015 1324   EOSABS 0.1 12/06/2015 1324   BASOSABS 0.0 12/06/2015 1324    Hgb A1C Lab Results  Component Value Date   HGBA1C 5.3 08/27/2022       Assessment and Plan:  Assessment and Plan    Post-Infectious Cough. Viral URI with Cough Persistent dry cough following a lower respiratory infection treated with azithromycin, promethazine DM cough syrup, and an inhaler. No fever, chills, nausea, or vomiting. -  Prescribe Tessalon cough tablets TID. -Continue use of inhaler as needed. -Start over-the-counter Claritin, Allegra, or Zyrtec for 10 days.   RTC in 1 month for follow-up of chronic conditions  Follow Up Instructions:    I discussed the assessment and treatment plan with the patient. The patient was provided an opportunity to ask questions and all were answered. The patient agreed with the plan and demonstrated an understanding of the instructions.   The patient was advised to call back or seek an in-person evaluation if the symptoms worsen or if the condition fails to improve as anticipated.   Denise Reaper, NP

## 2023-02-18 ENCOUNTER — Other Ambulatory Visit: Payer: Self-pay | Admitting: Internal Medicine

## 2023-02-19 NOTE — Telephone Encounter (Signed)
Requested Prescriptions  Refused Prescriptions Disp Refills   etonogestrel-ethinyl estradiol (NUVARING) 0.12-0.015 MG/24HR vaginal ring [Pharmacy Med Name: NUVARING] 3 each 1    Sig: INSERT 1 RING VAGINALLY FOR 3 WEEKS CONSECUTIVELY, THEN REMOVE FOR 1 WEEK     OB/GYN:  Contraceptives Passed - 02/18/2023  3:33 AM      Passed - Last BP in normal range    BP Readings from Last 1 Encounters:  08/27/22 110/74         Passed - Valid encounter within last 12 months    Recent Outpatient Visits           2 weeks ago Post-viral cough syndrome   [place] [facility] [place], Salvadore Oxford, NP   5 months ago Encounter for general adult medical examination with abnormal findings   [place] [place], Salvadore Oxford, NP   11 months ago Mixed hyperlipidemia   [place] [facility] [place], Salvadore Oxford, NP   1 year ago Encounter for general adult medical examination with abnormal findings   [place] [facility] [place], Salvadore Oxford, NP   2 years ago Annual physical exam   [facility] [place], Salvadore Oxford, NP       Future Appointments             In 1 week [place], Salvadore Oxford, NP [facility]   In 3 weeks [place], Salvadore Oxford, NP [facility], [facility]            Passed - Patient is not a smoker

## 2023-03-03 ENCOUNTER — Ambulatory Visit: Payer: BC Managed Care – PPO | Admitting: Internal Medicine

## 2023-03-03 ENCOUNTER — Encounter: Payer: Self-pay | Admitting: Internal Medicine

## 2023-03-03 NOTE — Progress Notes (Deleted)
Subjective:    Patient ID: Denise Jarvis, female    DOB: 2001-02-06, 22 y.o.   MRN: [id number]  HPI  Patient presents to clinic today for follow-up of chronic conditions.  Migraines: These occur a few times per week.  She is not sure what triggers this. She is taking topamax and imitrex as prescribed.  She does not follow with neurology.  Acne: She is not currently taking any medications for this.  She is not currently following with dermatology.  HLD: Her last LDL was 111, triglycerides 76, 08/2022.  She denies myalgias on lovastatin.  She does not consume a low-fat diet.  Anxiety and depression: Chronic, managed on escitalopram.  She is not currently seeing a therapist.  She denies SI/HI.  Review of Systems     Past Medical History:  Diagnosis Date   Family history of adverse reaction to anesthesia    mother has "severe PONV"   Frequent headaches     Current Outpatient Medications  Medication Sig Dispense Refill   benzonatate (TESSALON) 200 MG capsule Take 1 capsule (200 mg total) by mouth 3 (three) times daily as needed. 30 capsule 0   escitalopram (LEXAPRO) 10 MG tablet TAKE 1 TABLET(10 MG) BY MOUTH DAILY 90 tablet 1   etonogestrel-ethinyl estradiol (NUVARING) 0.12-0.015 MG/24HR vaginal ring INSERT 1 RING VAGINALLY AND LEAVE IN PLACE FOR 3 CONSECUTIVE WEEKS, THEN REMOVE FOR 1 WEEK 3 each 1   fluticasone (FLONASE) 50 MCG/ACT nasal spray SHAKE LIQUID AND USE 2 SPRAYS IN EACH NOSTRIL DAILY (Patient not taking: Reported on 08/27/2022) 16 g 6   lovastatin (MEVACOR) 20 MG tablet Take 1 tablet (20 mg total) by mouth at bedtime. 90 tablet 3   predniSONE (DELTASONE) 10 MG tablet Take 6 tabs on day 1, 5 tabs on day 2, 4 tabs on day 3, 3 tabs on day 4, 2 tabs on day 5, 1 tab on day 6 21 tablet 0   SUMAtriptan (IMITREX) 50 MG tablet Take 1 tablet (50 mg total) by mouth every 2 (two) hours as needed for migraine. May repeat in 2 hours if headache persists or recurs. 10 tablet 5   topiramate  (TOPAMAX) 50 MG tablet TAKE 1 TABLET(50 MG) BY MOUTH TWICE DAILY 90 tablet 0   No current facility-administered medications for this visit.    No Known Allergies  Family History  Problem Relation Age of Onset   Hypertension Mother    Depression Mother        Post-Partum Depression   Healthy Father    Migraines Neg Hx    Seizures Neg Hx    Anxiety disorder Neg Hx    Bipolar disorder Neg Hx    Schizophrenia Neg Hx    ADD / ADHD Neg Hx    Autism Neg Hx    Drug abuse Neg Hx    Suicidality Neg Hx     Social History   Socioeconomic History   Marital status: Single    Spouse name: Not on file   Number of children: Not on file   Years of education: Not on file   Highest education level: Not on file  Occupational History   Not on file  Tobacco Use   Smoking status: Never   Smokeless tobacco: Never  Vaping Use   Vaping status: Never Used  Substance and Sexual Activity   Alcohol use: No   Drug use: No   Sexual activity: Never  Other Topics Concern   Not on file  Social History Narrative   Denise Jarvis is a 10th grade student [employer]; does well in school. Denise Jarvis is active in cross country, winter track and spring track.       Denise Jarvis lives with her parents and with Denise Jarvis her baby sister (dog).       No family history of learning disabilities.    No IEP or 504 plans in school.    Social Drivers of Corporate investment banker Strain: Not on file  Food Insecurity: Not on file  Transportation Needs: Not on file  Physical Activity: Not on file  Stress: Not on file  Social Connections: Not on file  Intimate Partner Violence: Not on file     Constitutional: Patient reports intermittent headaches.  Denies fever, malaise, fatigue, or abrupt weight changes.  HEENT: Denies eye pain, eye redness, ear pain, ringing in the ears, wax buildup, runny nose, nasal congestion, bloody nose, or sore throat. Respiratory: Denies difficulty breathing, shortness of breath,  cough or sputum production.   Cardiovascular: Denies chest pain, chest tightness, palpitations or swelling in the hands or feet.  Gastrointestinal: Denies abdominal pain, bloating, constipation, diarrhea or blood in the stool.  GU: Denies urgency, frequency, pain with urination, burning sensation, blood in urine, odor or discharge. Musculoskeletal: Denies decrease in range of motion, difficulty with gait, muscle pain or joint pain and swelling.  Skin: Patient reports acne.  Denies redness, rashes, lesions or ulcercations.  Neurological: Denies dizziness, difficulty with memory, difficulty with speech or problems with balance and coordination.  Psych: Patient has a history of anxiety and depression.  Denies SI/HI.  No other specific complaints in a complete review of systems (except as listed in HPI above).  Objective:   Physical Exam There were no vitals taken for this visit.  Wt Readings from Last 3 Encounters:  08/27/22 130 lb (59 kg)  02/25/22 135 lb (61.2 kg)  07/18/21 125 lb (56.7 kg)    General: Appears her stated age, well developed, well nourished in NAD. Skin: Warm, dry and intact. HEENT: Head: normal shape and size; Eyes: sclera white, no icterus, conjunctiva pink, PERRLA and EOMs intact;  Cardiovascular: Normal rate and rhythm. S1,S2 noted.  No murmur, rubs or gallops noted.  Pulmonary/Chest: Normal effort and positive vesicular breath sounds. No respiratory distress. No wheezes, rales or ronchi noted.  Musculoskeletal:  No difficulty with gait.  Neurological: Alert and oriented. Coordination normal.  Psychiatric: Mood and affect normal. Behavior is normal. Judgment and thought content normal.    BMET    Component Value Date/Time   NA 139 08/27/2022 0830   K 4.1 08/27/2022 0830   CL 107 08/27/2022 0830   CO2 24 08/27/2022 0830   GLUCOSE 87 08/27/2022 0830   BUN 14 08/27/2022 0830   CREATININE 0.90 08/27/2022 0830   CALCIUM 9.4 08/27/2022 0830    Lipid Panel      Component Value Date/Time   CHOL 192 08/27/2022 0830   TRIG 76 08/27/2022 0830   HDL 64 08/27/2022 0830   CHOLHDL 3.0 08/27/2022 0830   VLDL 35.8 09/01/2019 1205   LDLCALC 111 (H) 08/27/2022 0830    CBC    Component Value Date/Time   WBC 8.6 08/27/2022 0830   RBC 4.71 08/27/2022 0830   HGB 14.3 08/27/2022 0830   HCT 43.4 08/27/2022 0830   PLT 236 08/27/2022 0830   MCV 92.1 08/27/2022 0830   MCH 30.4 08/27/2022 0830   MCHC 32.9 08/27/2022 0830   RDW 12.1  08/27/2022 0830   LYMPHSABS 3.2 12/06/2015 1324   MONOABS 0.8 12/06/2015 1324   EOSABS 0.1 12/06/2015 1324   BASOSABS 0.0 12/06/2015 1324    Hgb A1C Lab Results  Component Value Date   HGBA1C 5.3 08/27/2022            Assessment & Plan:      RTC in 6 months for annual exam Nicki Reaper, NP

## 2023-03-12 ENCOUNTER — Ambulatory Visit: Payer: Self-pay | Admitting: Internal Medicine

## 2023-03-12 ENCOUNTER — Encounter: Payer: Self-pay | Admitting: Internal Medicine

## 2023-03-12 VITALS — BP 102/68 | Ht 63.0 in | Wt 138.2 lb

## 2023-03-12 DIAGNOSIS — F32A Depression, unspecified: Secondary | ICD-10-CM

## 2023-03-12 DIAGNOSIS — G43839 Menstrual migraine, intractable, without status migrainosus: Secondary | ICD-10-CM

## 2023-03-12 DIAGNOSIS — L709 Acne, unspecified: Secondary | ICD-10-CM

## 2023-03-12 DIAGNOSIS — E7800 Pure hypercholesterolemia, unspecified: Secondary | ICD-10-CM

## 2023-03-12 DIAGNOSIS — F419 Anxiety disorder, unspecified: Secondary | ICD-10-CM

## 2023-03-12 MED ORDER — NURTEC 75 MG PO TBDP: 75.0000 mg | ORAL_TABLET | Freq: Every day | ORAL | 2 refills | Status: DC | PRN

## 2023-03-12 MED ORDER — ESCITALOPRAM OXALATE 10 MG PO TABS: 5.0000 mg | ORAL_TABLET | Freq: Every day | ORAL | 1 refills | Status: DC

## 2023-03-12 MED ORDER — TOPIRAMATE 50 MG PO TABS: 50.0000 mg | ORAL_TABLET | Freq: Every day | ORAL | 0 refills | Status: DC

## 2023-03-12 NOTE — Assessment & Plan Note (Signed)
Managed without meds She does plan on following up with dermatology

## 2023-03-12 NOTE — Progress Notes (Signed)
Subjective:    Patient ID: Denise Jarvis, female    DOB: 2000/05/02, 22 y.o.   MRN: [id number]  HPI  Patient presents to clinic today for follow-up of chronic conditions.  Migraines: These occur a few times per week.  She is not sure what triggers this. She is taking topamax and imitrex as prescribed.  She does not follow with neurology.  Acne: She taking any medications for this.  She is not currently following with dermatology.  HLD: Her last LDL was 111, triglycerides 76, 08/2022.  She is taking lovastatin as prescribed.  She does not consume a low-fat diet.  Anxiety and depression: Chronic, managed on escitalopram. She has been feeling tired lately and thinks the escitalopram may be the cause.  She is not currently seeing a therapist.  She denies SI/HI.  Review of Systems     Past Medical History:  Diagnosis Date   Family history of adverse reaction to anesthesia    mother has "severe PONV"   Frequent headaches     Current Outpatient Medications  Medication Sig Dispense Refill   benzonatate (TESSALON) 200 MG capsule Take 1 capsule (200 mg total) by mouth 3 (three) times daily as needed. 30 capsule 0   escitalopram (LEXAPRO) 10 MG tablet TAKE 1 TABLET(10 MG) BY MOUTH DAILY 90 tablet 1   etonogestrel-ethinyl estradiol (NUVARING) 0.12-0.015 MG/24HR vaginal ring INSERT 1 RING VAGINALLY AND LEAVE IN PLACE FOR 3 CONSECUTIVE WEEKS, THEN REMOVE FOR 1 WEEK 3 each 1   fluticasone (FLONASE) 50 MCG/ACT nasal spray SHAKE LIQUID AND USE 2 SPRAYS IN EACH NOSTRIL DAILY (Patient not taking: Reported on 08/27/2022) 16 g 6   lovastatin (MEVACOR) 20 MG tablet Take 1 tablet (20 mg total) by mouth at bedtime. 90 tablet 3   predniSONE (DELTASONE) 10 MG tablet Take 6 tabs on day 1, 5 tabs on day 2, 4 tabs on day 3, 3 tabs on day 4, 2 tabs on day 5, 1 tab on day 6 21 tablet 0   SUMAtriptan (IMITREX) 50 MG tablet Take 1 tablet (50 mg total) by mouth every 2 (two) hours as needed for migraine. May repeat  in 2 hours if headache persists or recurs. 10 tablet 5   topiramate (TOPAMAX) 50 MG tablet TAKE 1 TABLET(50 MG) BY MOUTH TWICE DAILY 90 tablet 0   No current facility-administered medications for this visit.    No Known Allergies  Family History  Problem Relation Age of Onset   Hypertension Mother    Depression Mother        Post-Partum Depression   Healthy Father    Migraines Neg Hx    Seizures Neg Hx    Anxiety disorder Neg Hx    Bipolar disorder Neg Hx    Schizophrenia Neg Hx    ADD / ADHD Neg Hx    Autism Neg Hx    Drug abuse Neg Hx    Suicidality Neg Hx     Social History   Socioeconomic History   Marital status: Single    Spouse name: Not on file   Number of children: Not on file   Years of education: Not on file   Highest education level: Not on file  Occupational History   Not on file  Tobacco Use   Smoking status: Never   Smokeless tobacco: Never  Vaping Use   Vaping status: Never Used  Substance and Sexual Activity   Alcohol use: No   Drug use: No   Sexual  activity: Never  Other Topics Concern   Not on file  Social History Narrative   Shakerria is a 10th grade student [employer]; does well in school. Lavada is active in cross country, winter track and spring track.       Lorea lives with her parents and with [name] her baby sister (dog).       No family history of learning disabilities.    No IEP or 504 plans in school.    Social Drivers of Corporate investment banker Strain: Not on file  Food Insecurity: Not on file  Transportation Needs: Not on file  Physical Activity: Not on file  Stress: Not on file  Social Connections: Not on file  Intimate Partner Violence: Not on file     Constitutional: Patient reports intermittent headaches, fatigue.  Denies fever, malaise, or abrupt weight changes.  HEENT: Denies eye pain, eye redness, ear pain, ringing in the ears, wax buildup, runny nose, nasal congestion, bloody nose, or sore  throat. Respiratory: Denies difficulty breathing, shortness of breath, cough or sputum production.   Cardiovascular: Denies chest pain, chest tightness, palpitations or swelling in the hands or feet.  Gastrointestinal: Denies abdominal pain, bloating, constipation, diarrhea or blood in the stool.  GU: Denies urgency, frequency, pain with urination, burning sensation, blood in urine, odor or discharge. Musculoskeletal: Denies decrease in range of motion, difficulty with gait, muscle pain or joint pain and swelling.  Skin: Patient reports acne.  Denies redness, rashes, lesions or ulcercations.  Neurological: Denies dizziness, difficulty with memory, difficulty with speech or problems with balance and coordination.  Psych: Patient has a history of anxiety and depression.  Denies SI/HI.  No other specific complaints in a complete review of systems (except as listed in HPI above).  Objective:   Physical Exam BP 102/68 (BP Location: Left Arm, Patient Position: Sitting, Cuff Size: Normal)   Ht 5\' 3"  (1.6 m)   Wt 138 lb 3.2 oz (62.7 kg)   LMP 02/10/2023 (Approximate)   BMI 24.48 kg/m    Wt Readings from Last 3 Encounters:  08/27/22 130 lb (59 kg)  02/25/22 135 lb (61.2 kg)  07/18/21 125 lb (56.7 kg)    General: Appears her stated age, well developed, well nourished in NAD. Cardiovascular: Normal rate and rhythm. S1,S2 noted.  No murmur, rubs or gallops noted.  Pulmonary/Chest: Normal effort and positive vesicular breath sounds. No respiratory distress. No wheezes, rales or ronchi noted.  Musculoskeletal:  No difficulty with gait.  Neurological: Alert and oriented. Coordination normal.  Psychiatric: Mood and affect normal. Behavior is normal. Judgment and thought content normal.    BMET    Component Value Date/Time   NA 139 08/27/2022 0830   K 4.1 08/27/2022 0830   CL 107 08/27/2022 0830   CO2 24 08/27/2022 0830   GLUCOSE 87 08/27/2022 0830   BUN 14 08/27/2022 0830   CREATININE  0.90 08/27/2022 0830   CALCIUM 9.4 08/27/2022 0830    Lipid Panel     Component Value Date/Time   CHOL 192 08/27/2022 0830   TRIG 76 08/27/2022 0830   HDL 64 08/27/2022 0830   CHOLHDL 3.0 08/27/2022 0830   VLDL 35.8 09/01/2019 1205   LDLCALC 111 (H) 08/27/2022 0830    CBC    Component Value Date/Time   WBC 8.6 08/27/2022 0830   RBC 4.71 08/27/2022 0830   HGB 14.3 08/27/2022 0830   HCT 43.4 08/27/2022 0830   PLT 236 08/27/2022 0830  MCV 92.1 08/27/2022 0830   MCH 30.4 08/27/2022 0830   MCHC 32.9 08/27/2022 0830   RDW 12.1 08/27/2022 0830   LYMPHSABS 3.2 12/06/2015 1324   MONOABS 0.8 12/06/2015 1324   EOSABS 0.1 12/06/2015 1324   BASOSABS 0.0 12/06/2015 1324    Hgb A1C Lab Results  Component Value Date   HGBA1C 5.3 08/27/2022            Assessment & Plan:      RTC in 6 months for annual exam Nicki Reaper, NP

## 2023-03-12 NOTE — Assessment & Plan Note (Signed)
Will decrease escitalopram to 5 mg daily given her fatigue Encouraged daily exercise Support offered

## 2023-03-12 NOTE — Assessment & Plan Note (Signed)
Continue lovastatin Encouraged her to consume a low-fat diet

## 2023-03-12 NOTE — Assessment & Plan Note (Signed)
Continue Topamax Will try to get Nurtec approved in place of Imitrex Try to identify triggers and avoid them

## 2023-03-12 NOTE — Patient Instructions (Signed)
Migraine Headache A migraine headache is a very strong throbbing pain on one or both sides of your head. This type of headache can also cause other symptoms. It can last from 4 hours to 3 days. Talk with your doctor about what things may bring on (trigger) this condition. What are the causes? The exact cause of a migraine is not known. This condition may be brought on or caused by: Smoking. Medicines, such as: Medicine used to treat chest pain (nitroglycerin). Birth control pills. Estrogen. Some blood pressure medicines. Certain substances in some foods or drinks. Foods and drinks, such as: Cheese. Chocolate. Alcohol. Caffeine. Doing physical activity that is very hard. Other things that may trigger a migraine headache include: Periods. Pregnancy. Hunger. Stress. Getting too much or too little sleep. Weather changes. Feeling tired (fatigue). What increases the risk? Being 25-55 years old. Being female. Having a family history of migraine headaches. Being Caucasian. Having a mental health condition, such as being sad (depressed) or feeling worried or nervous (anxious). Being very overweight (obese). What are the signs or symptoms? A throbbing pain. This pain may: Happen in any area of the head, such as on one or both sides. Make it hard to do daily activities. Get worse with physical activity. Get worse around bright lights, loud noises, or smells. Other symptoms may include: Feeling like you may vomit (nauseous). Vomiting. Dizziness. Before a migraine headache starts, you may get warning signs (an aura). An aura may include: Seeing flashing lights or having blind spots. Seeing bright spots, halos, or zigzag lines. Having tunnel vision or blurred vision. Having numbness or a tingling feeling. Having trouble talking. Having weak muscles. After a migraine ends, you may have symptoms. These may include: Tiredness. Trouble thinking (concentrating). How is this  treated? Taking medicines that: Relieve pain. Relieve the feeling like you may vomit. Prevent migraine headaches. Treatment may also include: Acupuncture. Lifestyle changes like avoiding foods that bring on migraine headaches. Learning ways to control your body functions (biofeedback). Therapy to help you know and deal with negative thoughts (cognitive behavioral therapy). Follow these instructions at home: Medicines Take over-the-counter and prescription medicines only as told by your doctor. If told, take steps to prevent problems with pooping (constipation). You may need to: Drink enough fluid to keep your pee (urine) pale yellow. Take medicines. You will be told what medicines to take. Eat foods that are high in fiber. These include beans, whole grains, and fresh fruits and vegetables. Limit foods that are high in fat and sugar. These include fried or sweet foods. Ask your doctor if you should avoid driving or using machines while you are taking your medicine. Lifestyle  Do not drink alcohol. Do not smoke or use any products that contain nicotine or tobacco. If you need help quitting, ask your doctor. Get 7-9 hours of sleep each night, or the amount recommended by your doctor. Find ways to deal with stress, such as meditation, deep breathing, or yoga. Try to exercise often. This can help lessen how bad and how often your migraines happen. General instructions Keep a journal to find out what may bring on your migraine headaches. This can help you avoid those things. For example, write down: What you eat and drink. How much sleep you get. Any change to your medicines or diet. If you have a migraine headache: Avoid things that make your symptoms worse, such as bright lights. Lie down in a dark, quiet room. Do not drive or use machinery. Ask your   doctor what activities are safe for you. Where to find more information Coalition for Headache and Migraine Patients (CHAMP):  headachemigraine.org American Migraine Foundation: americanmigrainefoundation.org National Headache Foundation: headaches.org Contact a doctor if: You get a migraine headache that is different or worse than others you have had. You have more than 15 days of headaches in one month. Get help right away if: Your migraine headache gets very bad. Your migraine headache lasts more than 72 hours. You have a fever or stiff neck. You have trouble seeing. Your muscles feel weak or like you cannot control them. You lose your balance a lot. You have trouble walking. You faint. You have a seizure. This information is not intended to replace advice given to you by your health care provider. Make sure you discuss any questions you have with your health care provider. Document Revised: 10/28/2021 Document Reviewed: 10/28/2021 Elsevier Patient Education  2024 Elsevier Inc.  

## 2023-03-31 ENCOUNTER — Other Ambulatory Visit: Payer: Self-pay | Admitting: Internal Medicine

## 2023-04-01 NOTE — Telephone Encounter (Signed)
 Requested medication (s) are due for refill today: Yes  Requested medication (s) are on the active medication list: Yes  Last refill:  03/12/23 #45, 1RF  Future visit scheduled: Yes  Notes to clinic:  Sig is different from what's listed in chart, refill may not be appropriate.     Requested Prescriptions  Pending Prescriptions Disp Refills   escitalopram  (LEXAPRO ) 10 MG tablet [Pharmacy Med Name: ESCITALOPRAM  10MG  TABLETS] 90 tablet     Sig: TAKE 1 TABLET(10 MG) BY MOUTH DAILY     Psychiatry:  Antidepressants - SSRI Passed - 04/01/2023  9:40 AM      Passed - Completed PHQ-2 or PHQ-9 in the last 360 days      Passed - Valid encounter within last 6 months    Recent Outpatient Visits           2 weeks ago Intractable menstrual migraine without status migrainosus   [place], Rankin Buzzard, NP   1 month ago Post-viral cough syndrome   [place] [facility] [place], Rankin Buzzard, NP   7 months ago Encounter for general adult medical examination with abnormal findings   [place] [facility] [place], Rankin Buzzard, NP   1 year ago Mixed hyperlipidemia   [place] [facility] [place], Rankin Buzzard, NP   1 year ago Encounter for general adult medical examination with abnormal findings   [place] [facility] [place], Rankin Buzzard, NP       Future Appointments             In 5 months [place], Rankin Buzzard, NP  [facility]

## 2023-04-27 ENCOUNTER — Encounter: Payer: Self-pay | Admitting: Family Medicine

## 2023-04-27 ENCOUNTER — Other Ambulatory Visit: Payer: Self-pay

## 2023-04-27 ENCOUNTER — Ambulatory Visit (INDEPENDENT_AMBULATORY_CARE_PROVIDER_SITE_OTHER): Payer: 59 | Admitting: Family Medicine

## 2023-04-27 VITALS — BP 120/70 | HR 65 | Ht 63.0 in | Wt 140.0 lb

## 2023-04-27 DIAGNOSIS — G43909 Migraine, unspecified, not intractable, without status migrainosus: Secondary | ICD-10-CM

## 2023-04-27 MED ORDER — RIZATRIPTAN BENZOATE 10 MG PO TBDP: 10.0000 mg | ORAL_TABLET | ORAL | 2 refills | Status: DC | PRN

## 2023-04-27 MED ORDER — UBRELVY 50 MG PO TABS: 1.0000 | ORAL_TABLET | ORAL | Status: DC | PRN

## 2023-04-27 MED ORDER — UBRELVY 100 MG PO TABS: 1.0000 | ORAL_TABLET | ORAL | Status: DC | PRN

## 2023-04-27 MED ORDER — ONDANSETRON 4 MG PO TBDP: 4.0000 mg | ORAL_TABLET | Freq: Three times a day (TID) | ORAL | 0 refills | Status: DC | PRN

## 2023-04-27 NOTE — Progress Notes (Signed)
 Subjective:    Patient ID: Denise Jarvis, female    DOB: [birth year], 23 y.o.   MRN: [id number]  Denise Jarvis is a 23 y.o. female presenting on 04/27/2023 for Migraine  PCP Helayne Lo, FNP   Patient presents for a same day appointment.   HPI  Discussed the use of AI scribe software for clinical note transcription with the patient, who gave verbal consent to proceed.  History of Present Illness    Denise Jarvis is a 23 year old female with migraines who presents with persistent headache and nausea.  She has been experiencing a persistent migraine headache since Friday, accompanied by nausea and vomiting. The vomiting subsided on Saturday but returned yesterday and today. The migraine is consistent, and her migraines typically do not last this long, usually resolving within a day with medication.  She has a history of frequent migraines, occurring multiple times a week. Her usual treatment includes Nurtec, which she started last month, and Topamax  for prevention. Previously, she was on sumatriptan  50 mg, which she stopped using. Nurtec initially slowed down her symptoms but did not provide quick relief, taking longer to completely resolve the headache. She has not tried rizatriptan  or Ubrelvy  before. She has been on Zofran  for nausea in the past, which she found helpful.  She has an upcoming appointment with a neurologist in March at [facility] for further evaluation of her migraines.         04/27/2023   10:32 AM 03/12/2023    8:50 AM 08/27/2022    8:34 AM  Depression screen PHQ 2/9  Decreased Interest 1 1 0  Down, Depressed, Hopeless 1 0 0  PHQ - 2 Score 2 1 0  Altered sleeping 1 3   Tired, decreased energy 1 3   Change in appetite 0 0   Feeling bad or failure about yourself  1 0   Trouble concentrating 1 0   Moving slowly or fidgety/restless 0 0   Suicidal thoughts 0 0   PHQ-9 Score 6 7   Difficult doing work/chores Somewhat difficult Somewhat difficult        04/27/2023    10:33 AM 03/12/2023    8:51 AM 08/27/2022    8:34 AM 02/25/2022    9:37 AM  GAD 7 : Generalized Anxiety Score  Nervous, Anxious, on Edge 1 0 1 1  Control/stop worrying 1 0 1 0  Worry too much - different things 1 0 1 1  Trouble relaxing 1 0 1 1  Restless 1 0 1 1  Easily annoyed or irritable 1 0 0 1  Afraid - awful might happen 0 0 0 1  Total GAD 7 Score 6 0 5 6  Anxiety Difficulty Somewhat difficult Not difficult at all Not difficult at all Somewhat difficult    Social History   Tobacco Use   Smoking status: Never   Smokeless tobacco: Never  Vaping Use   Vaping status: Never Used  Substance Use Topics   Alcohol use: No   Drug use: No    Review of Systems Per HPI unless specifically indicated above     Objective:    BP 120/70   Pulse 65   Ht 5\' 3"  (1.6 m)   Wt 140 lb (63.5 kg)   SpO2 97%   BMI 24.80 kg/m   Wt Readings from Last 3 Encounters:  04/27/23 140 lb (63.5 kg)  03/12/23 138 lb 3.2 oz (62.7 kg)  08/27/22 130 lb (59 kg)  Physical Exam Vitals and nursing note reviewed.  Constitutional:      General: She is not in acute distress.    Appearance: She is well-developed. She is not diaphoretic.     Comments: Well-appearing, comfortable, cooperative  HENT:     Head: Normocephalic and atraumatic.  Eyes:     General:        Right eye: No discharge.        Left eye: No discharge.     Conjunctiva/sclera: Conjunctivae normal.  Neck:     Thyroid: No thyromegaly.  Cardiovascular:     Rate and Rhythm: Normal rate and regular rhythm.     Heart sounds: Normal heart sounds. No murmur heard. Pulmonary:     Effort: Pulmonary effort is normal. No respiratory distress.     Breath sounds: Normal breath sounds. No wheezing or rales.  Musculoskeletal:        General: Normal range of motion.     Cervical back: Normal range of motion and neck supple.  Lymphadenopathy:     Cervical: No cervical adenopathy.  Skin:    General: Skin is warm and dry.     Findings:  No erythema or rash.  Neurological:     Mental Status: She is alert and oriented to person, place, and time.  Psychiatric:        Behavior: Behavior normal.     Comments: Well groomed, good eye contact, normal speech and thoughts     Results for orders placed or performed in visit on 08/27/22  Cytology - PAP   Collection Time: 08/27/22  8:24 AM  Result Value Ref Range   Adequacy      Satisfactory for evaluation; transformation zone component PRESENT.   Diagnosis      - Negative for intraepithelial lesion or malignancy (NILM)  CBC   Collection Time: 08/27/22  8:30 AM  Result Value Ref Range   WBC 8.6 3.8 - 10.8 Thousand/uL   RBC 4.71 3.80 - 5.10 Million/uL   Hemoglobin 14.3 11.7 - 15.5 g/dL   HCT 16.1 09.6 - 04.5 %   MCV 92.1 80.0 - 100.0 fL   MCH 30.4 27.0 - 33.0 pg   MCHC 32.9 32.0 - 36.0 g/dL   RDW 40.9 81.1 - 91.4 %   Platelets 236 140 - 400 Thousand/uL   MPV 10.2 7.5 - 12.5 fL  COMPLETE METABOLIC PANEL WITH GFR   Collection Time: 08/27/22  8:30 AM  Result Value Ref Range   Glucose, Bld 87 65 - 99 mg/dL   BUN 14 7 - 25 mg/dL   Creat 7.82 9.56 - 2.13 mg/dL   eGFR 93 > OR = 60 YQ/MVH/8.46N6   BUN/Creatinine Ratio SEE NOTE: 6 - 22 (calc)   Sodium 139 135 - 146 mmol/L   Potassium 4.1 3.5 - 5.3 mmol/L   Chloride 107 98 - 110 mmol/L   CO2 24 20 - 32 mmol/L   Calcium 9.4 8.6 - 10.2 mg/dL   Total Protein 6.6 6.1 - 8.1 g/dL   Albumin 4.3 3.6 - 5.1 g/dL   Globulin 2.3 1.9 - 3.7 g/dL (calc)   AG Ratio 1.9 1.0 - 2.5 (calc)   Total Bilirubin 0.4 0.2 - 1.2 mg/dL   Alkaline phosphatase (APISO) 62 31 - 125 U/L   AST 17 10 - 30 U/L   ALT 13 6 - 29 U/L  Lipid panel   Collection Time: 08/27/22  8:30 AM  Result Value Ref Range   Cholesterol 192 <200  mg/dL   HDL 64 > OR = 50 mg/dL   Triglycerides 76 <161 mg/dL   LDL Cholesterol (Calc) 111 (H) mg/dL (calc)   Total CHOL/HDL Ratio 3.0 <5.0 (calc)   Non-HDL Cholesterol (Calc) 128 <130 mg/dL (calc)  Hemoglobin W9U   Collection  Time: 08/27/22  8:30 AM  Result Value Ref Range   Hgb A1c MFr Bld 5.3 <5.7 % of total Hgb   Mean Plasma Glucose 105 mg/dL   eAG (mmol/L) 5.8 mmol/L      Assessment & Plan:   Problem List Items Addressed This Visit     Episodic migraine - Primary   Relevant Medications   rizatriptan  (MAXALT -MLT) 10 MG disintegrating tablet   ondansetron  (ZOFRAN -ODT) 4 MG disintegrating tablet     Episodic Migraine Persistent migraine since Friday with associated nausea and vomiting. Migraines are typically shorter in duration and multiple times a week. Currently on Topamax  for prevention and Nurtec for acute management. Previously on sumatriptan . Upcoming neurology appointment in March at [facility] -Provided samples of Ubrelvy  50mg  and 100mg  for acute management. Instructed to take one at onset of severe headache, may repeat dose within 2-hour window if needed. -Prescribed rizatriptan  10mg  as an alternative to sumatriptan  for acute management. Instructed to take one at onset of severe headache, may repeat dose within 2-hour window if needed. -Consider Qulipta for long-term prevention. -Contact office with preference between Ubrelvy  and rizatriptan  for future prescriptions.  Nausea Associated with migraines. -Prescribed Zofran  for nausea management.         No orders of the defined types were placed in this encounter.   Meds ordered this encounter  Medications   rizatriptan  (MAXALT -MLT) 10 MG disintegrating tablet    Sig: Take 1 tablet (10 mg total) by mouth as needed for migraine. May repeat in 2 hours if needed    Dispense:  10 tablet    Refill:  2   ondansetron  (ZOFRAN -ODT) 4 MG disintegrating tablet    Sig: Take 1 tablet (4 mg total) by mouth every 8 (eight) hours as needed for nausea or vomiting.    Dispense:  30 tablet    Refill:  0    Follow up plan: Return if symptoms worsen or fail to improve.   Domingo Friend, DO [facility] 04/27/2023, 10:39 AM

## 2023-04-27 NOTE — Patient Instructions (Addendum)
 Thank you for coming to the office today.  Samples Ubrelvy   50 and 100mg  Same instructions as the Triptans Take one at first onset of severe migraines headache, and then within 2 hour window may repeat dose if needed. Then done for 24 hours.  Similar with the Rizatriptan  dissolving tab, 10 pills in bottle, 2 hour window, can repeat. Refills added  Contact our office mychart message or call with preference.   Migraine prevention  - New medications available - Qulipta tablet daily prevention - Consider the longer term injectable medications - Emgality, Aimovig, Ajovy    Please schedule a Follow-up Appointment to: Return if symptoms worsen or fail to improve.  If you have any other questions or concerns, please feel free to call the office or send a message through MyChart. You may also schedule an earlier appointment if necessary.  Additionally, you may be receiving a survey about your experience at our office within a few days to 1 week by e-mail or mail. We value your feedback.  Domingo Friend, DO [facility], [place]

## 2023-04-28 ENCOUNTER — Other Ambulatory Visit: Payer: Self-pay | Admitting: Internal Medicine

## 2023-04-28 DIAGNOSIS — G43839 Menstrual migraine, intractable, without status migrainosus: Secondary | ICD-10-CM

## 2023-04-29 NOTE — Telephone Encounter (Signed)
Dose increase, The original prescription was discontinued on 02/25/2022 by Lorre Munroe, NP.   Requested Prescriptions  Pending Prescriptions Disp Refills   topiramate (TOPAMAX) 25 MG tablet [Pharmacy Med Name: TOPIRAMATE 25MG  TABLETS] 90 tablet 3    Sig: TAKE 1 TABLET(25 MG) BY MOUTH AT BEDTIME     Neurology: Anticonvulsants - topiramate & zonisamide Passed - 04/29/2023  1:16 PM      Passed - Cr in normal range and within 360 days    Creat  Date Value Ref Range Status  08/27/2022 0.90 0.50 - 0.96 mg/dL Final         Passed - CO2 in normal range and within 360 days    CO2  Date Value Ref Range Status  08/27/2022 24 20 - 32 mmol/L Final         Passed - ALT in normal range and within 360 days    ALT  Date Value Ref Range Status  08/27/2022 13 6 - 29 U/L Final         Passed - AST in normal range and within 360 days    AST  Date Value Ref Range Status  08/27/2022 17 10 - 30 U/L Final         Passed - Completed PHQ-2 or PHQ-9 in the last 360 days      Passed - Valid encounter within last 12 months    Recent Outpatient Visits           1 month ago Intractable menstrual migraine without status migrainosus   [place] [facility] [place], Salvadore Oxford, NP   2 months ago Post-viral cough syndrome   [place] [facility] [place], Salvadore Oxford, NP   8 months ago Encounter for general adult medical examination with abnormal findings   [place] [facility] [place], Salvadore Oxford, NP   1 year ago Mixed hyperlipidemia   [place] [facility] [place], Salvadore Oxford, NP   1 year ago Encounter for general adult medical examination with abnormal findings   [place] [facility] [place], Salvadore Oxford, NP       Future Appointments             In 4 months [place], Salvadore Oxford, NP [facility], [facility]

## 2023-07-02 ENCOUNTER — Ambulatory Visit: Admitting: Family Medicine

## 2023-07-08 ENCOUNTER — Ambulatory Visit: Admitting: Internal Medicine

## 2023-07-08 ENCOUNTER — Encounter: Payer: Self-pay | Admitting: Internal Medicine

## 2023-07-08 VITALS — BP 102/70 | Ht 63.0 in | Wt 143.6 lb

## 2023-07-08 DIAGNOSIS — R350 Frequency of micturition: Secondary | ICD-10-CM

## 2023-07-08 DIAGNOSIS — F419 Anxiety disorder, unspecified: Secondary | ICD-10-CM | POA: Diagnosis not present

## 2023-07-08 DIAGNOSIS — Z3049 Encounter for surveillance of other contraceptives: Secondary | ICD-10-CM

## 2023-07-08 DIAGNOSIS — F32A Depression, unspecified: Secondary | ICD-10-CM

## 2023-07-08 DIAGNOSIS — R3915 Urgency of urination: Secondary | ICD-10-CM

## 2023-07-08 LAB — POCT URINE DIPSTICK
Bilirubin, UA: NEGATIVE
Blood, UA: NEGATIVE
Glucose, UA: NEGATIVE mg/dL
Ketones, POC UA: NEGATIVE mg/dL
Leukocytes, UA: NEGATIVE
Nitrite, UA: NEGATIVE
POC PROTEIN,UA: NEGATIVE
Spec Grav, UA: 1.02 (ref 1.010–1.025)
Urobilinogen, UA: 0.2 U/dL
pH, UA: 6.5 (ref 5.0–8.0)

## 2023-07-08 MED ORDER — ESCITALOPRAM OXALATE 5 MG PO TABS: 5.0000 mg | ORAL_TABLET | Freq: Every day | ORAL | 0 refills | Status: DC

## 2023-07-08 MED ORDER — ETONOGESTREL-ETHINYL ESTRADIOL 0.12-0.015 MG/24HR VA RING: VAGINAL_RING | VAGINAL | 1 refills | Status: DC

## 2023-07-08 NOTE — Patient Instructions (Signed)
Kegel Exercises  Kegel exercises can help strengthen your pelvic floor muscles. The pelvic floor is a group of muscles that support your rectum, small intestine, and bladder. In females, pelvic floor muscles also help support the uterus. These muscles help you control the flow of urine and stool (feces). Kegel exercises are painless and simple. They do not require any equipment. Your provider may suggest Kegel exercises to: Improve bladder and bowel control. Improve sexual response. Improve weak pelvic floor muscles after surgery to remove the uterus (hysterectomy) or after pregnancy, in females. Improve weak pelvic floor muscles after prostate gland removal or surgery, in males. Kegel exercises involve squeezing your pelvic floor muscles. These are the same muscles you squeeze when you try to stop the flow of urine or keep from passing gas. The exercises can be done while sitting, standing, or lying down, but it is best to vary your position. Ask your health care provider which exercises are safe for you. Do exercises exactly as told by your health care provider and adjust them as directed. Do not begin these exercises until told by your health care provider. Exercises How to do Kegel exercises: Squeeze your pelvic floor muscles tight. You should feel a tight lift in your rectal area. If you are a female, you should also feel a tightness in your vaginal area. Keep your stomach, buttocks, and legs relaxed. Hold the muscles tight for up to 10 seconds. Breathe normally. Relax your muscles for up to 10 seconds. Repeat as told by your health care provider. Repeat this exercise daily as told by your health care provider. Continue to do this exercise for at least 4-6 weeks, or for as long as told by your health care provider. You may be referred to a physical therapist who can help you learn more about how to do Kegel exercises. Depending on your condition, your health care provider may  recommend: Varying how long you squeeze your muscles. Doing several sets of exercises every day. Doing exercises for several weeks. Making Kegel exercises a part of your regular exercise routine. This information is not intended to replace advice given to you by your health care provider. Make sure you discuss any questions you have with your health care provider. Document Revised: 07/12/2020 Document Reviewed: 07/12/2020 Elsevier Patient Education  2024 Elsevier Inc.  

## 2023-07-08 NOTE — Assessment & Plan Note (Signed)
 Continue escitalopram , refilled Support offered

## 2023-07-08 NOTE — Progress Notes (Signed)
 Subjective:    Patient ID: Denise Jarvis, female    DOB: 2000-12-08, 23 y.o.   MRN: [id number]  HPI  Discussed the use of AI scribe software for clinical note transcription with the patient, who gave verbal consent to proceed.   Denise Jarvis is a 23 year old female who presents with urinary urgency and frequency for the past month.  She experiences a strong urge to urinate, describing it as an intense need to go, occurring approximately every twenty minutes after voiding. There is no unusual odor, cloudiness, or hematuria. She has no associated vaginal symptoms and has not experienced incontinence.  She consumes approximately forty ounces of water daily and has been reducing her soda and caffeine  intake. She does not smoke or vape and has not been performing any pelvic floor exercises such as Kegel exercises.     She would also like a refill of her escitalopram  and NuvaRing.  Review of Systems   Past Medical History:  Diagnosis Date   Family history of adverse reaction to anesthesia    mother has "severe PONV"   Frequent headaches     Current Outpatient Medications  Medication Sig Dispense Refill   escitalopram  (LEXAPRO ) 10 MG tablet Take 0.5 tablets (5 mg total) by mouth daily. 45 tablet 1   etonogestrel -ethinyl estradiol  (NUVARING) 0.12-0.015 MG/24HR vaginal ring INSERT 1 RING VAGINALLY AND LEAVE IN PLACE FOR 3 CONSECUTIVE WEEKS, THEN REMOVE FOR 1 WEEK 3 each 1   lovastatin  (MEVACOR ) 20 MG tablet Take 1 tablet (20 mg total) by mouth at bedtime. 90 tablet 3   ondansetron  (ZOFRAN -ODT) 4 MG disintegrating tablet Take 1 tablet (4 mg total) by mouth every 8 (eight) hours as needed for nausea or vomiting. 30 tablet 0   Rimegepant Sulfate (NURTEC) 75 MG TBDP Take 1 tablet (75 mg total) by mouth daily as needed (migraine headache). Max 1 tablet in 24 hours. 8 tablet 2   rizatriptan  (MAXALT -MLT) 10 MG disintegrating tablet Take 1 tablet (10 mg total) by mouth as needed for migraine. May  repeat in 2 hours if needed 10 tablet 2   topiramate  (TOPAMAX ) 50 MG tablet Take 1 tablet (50 mg total) by mouth daily. 90 tablet 0   Ubrogepant  (UBRELVY ) 100 MG TABS Take 1 tablet (100 mg total) by mouth as needed.     Ubrogepant  (UBRELVY ) 50 MG TABS Take 1 tablet (50 mg total) by mouth as needed.     No current facility-administered medications for this visit.    No Known Allergies  Family History  Problem Relation Age of Onset   Hypertension Mother    Depression Mother        Post-Partum Depression   Healthy Father    Migraines Neg Hx    Seizures Neg Hx    Anxiety disorder Neg Hx    Bipolar disorder Neg Hx    Schizophrenia Neg Hx    ADD / ADHD Neg Hx    Autism Neg Hx    Drug abuse Neg Hx    Suicidality Neg Hx     Social History   Socioeconomic History   Marital status: Single    Spouse name: Not on file   Number of children: Not on file   Years of education: Not on file   Highest education level: Not on file  Occupational History   Not on file  Tobacco Use   Smoking status: Never   Smokeless tobacco: Never  Vaping Use   Vaping status: Never Used  Substance and Sexual Activity   Alcohol use: No   Drug use: No   Sexual activity: Never  Other Topics Concern   Not on file  Social History Narrative   Shavonda is a 10th grade student [employer]; does well in school. Charolette is active in cross country, winter track and spring track.       Macaela lives with her parents and with Genene Kennel her baby sister (dog).       No family history of learning disabilities.    No IEP or 504 plans in school.    Social Drivers of Corporate investment banker Strain: Not on file  Food Insecurity: Not on file  Transportation Needs: Not on file  Physical Activity: Not on file  Stress: Not on file  Social Connections: Not on file  Intimate Partner Violence: Not on file     Constitutional: Denies fever, malaise, fatigue, headache or abrupt weight changes.   Cardiovascular: Denies chest pain, chest tightness, palpitations or swelling in the hands or feet.  Gastrointestinal: Denies abdominal pain, bloating, constipation, diarrhea or blood in the stool.  GU: Pt reports urinary urgency and frequency. Denies pain with urination, burning sensation, blood in urine, odor or discharge. Neurological: Denies dizziness, difficulty with memory, difficulty with speech or problems with balance and coordination.  Psych: Patient has a history of anxiety and depression.  Denies SI/HI.  No other specific complaints in a complete review of systems (except as listed in HPI above).      Objective:   Physical Exam  BP 102/70 (BP Location: Left Arm, Patient Position: Sitting, Cuff Size: Normal)   Ht 5\' 3"  (1.6 m)   Wt 143 lb 9.6 oz (65.1 kg)   LMP 06/22/2023 (Exact Date)   BMI 25.44 kg/m   Wt Readings from Last 3 Encounters:  04/27/23 140 lb (63.5 kg)  03/12/23 138 lb 3.2 oz (62.7 kg)  08/27/22 130 lb (59 kg)    General: Appears her stated age, overweight, in NAD. Cardiovascular: Normal rate. Pulmonary/Chest: Normal effort and positive vesicular breath sounds. No respiratory distress. No wheezes, rales or ronchi noted.  Neurological: Alert and oriented.  Psychiatric: Mood and affect normal. Behavior is normal. Judgment and thought content normal.    BMET    Component Value Date/Time   NA 139 08/27/2022 0830   K 4.1 08/27/2022 0830   CL 107 08/27/2022 0830   CO2 24 08/27/2022 0830   GLUCOSE 87 08/27/2022 0830   BUN 14 08/27/2022 0830   CREATININE 0.90 08/27/2022 0830   CALCIUM 9.4 08/27/2022 0830    Lipid Panel     Component Value Date/Time   CHOL 192 08/27/2022 0830   TRIG 76 08/27/2022 0830   HDL 64 08/27/2022 0830   CHOLHDL 3.0 08/27/2022 0830   VLDL 35.8 09/01/2019 1205   LDLCALC 111 (H) 08/27/2022 0830    CBC    Component Value Date/Time   WBC 8.6 08/27/2022 0830   RBC 4.71 08/27/2022 0830   HGB 14.3 08/27/2022 0830   HCT  43.4 08/27/2022 0830   PLT 236 08/27/2022 0830   MCV 92.1 08/27/2022 0830   MCH 30.4 08/27/2022 0830   MCHC 32.9 08/27/2022 0830   RDW 12.1 08/27/2022 0830   LYMPHSABS 3.2 12/06/2015 1324   MONOABS 0.8 12/06/2015 1324   EOSABS 0.1 12/06/2015 1324   BASOSABS 0.0 12/06/2015 1324    Hgb A1C Lab Results  Component Value Date   HGBA1C 5.3 08/27/2022  Assessment & Plan:   Assessment and Plan    Urinary urgency and frequency Normal urinalysis, no infection. Differential includes weak pelvic floor muscles, unlikely due to age and nulliparity. - Educated on Kegel exercises: 10 contractions per hour, 10-second hold, hourly while awake. - Recommended pelvic floor strengthening exercises, including planks. - Consider urologist referral if symptoms worsen or incontinence develops.       RTC in 2 months for your annual exam Helayne Lo, NP

## 2023-08-31 ENCOUNTER — Ambulatory Visit: Payer: Self-pay | Admitting: Internal Medicine

## 2023-09-09 ENCOUNTER — Other Ambulatory Visit: Payer: Self-pay | Admitting: Internal Medicine

## 2023-09-10 NOTE — Telephone Encounter (Signed)
 Requested medications are due for refill today.  yes  Requested medications are on the active medications list.  yes  Last refill. 09/01/2022 #90 3 rf  Future visit scheduled.   no  Notes to clinic.  Labs are expired.    Requested Prescriptions  Pending Prescriptions Disp Refills   lovastatin  (MEVACOR ) 20 MG tablet [Pharmacy Med Name: LOVASTATIN  20MG  TABLETS] 90 tablet 3    Sig: TAKE 1 TABLET(20 MG) BY MOUTH AT BEDTIME     Cardiovascular:  Antilipid - Statins 2 Failed - 09/10/2023  2:11 PM      Failed - Cr in normal range and within 360 days    Creat  Date Value Ref Range Status  08/27/2022 0.90 0.50 - 0.96 mg/dL Final         Failed - Lipid Panel in normal range within the last 12 months    Cholesterol  Date Value Ref Range Status  08/27/2022 192 <200 mg/dL Final   LDL Cholesterol (Calc)  Date Value Ref Range Status  08/27/2022 111 (H) mg/dL (calc) Final    Comment:    Reference range: <100 . Desirable range <100 mg/dL for primary prevention;   <70 mg/dL for patients with CHD or diabetic patients  with > or = 2 CHD risk factors. SABRA LDL-C is now calculated using the Martin-Hopkins  calculation, which is a validated novel method providing  better accuracy than the Friedewald equation in the  estimation of LDL-C.  Gladis APPLETHWAITE et al. SANDREA. 7986;689(80): 2061-2068  (http://education.[facility].com/faq/FAQ164)    HDL  Date Value Ref Range Status  08/27/2022 64 > OR = 50 mg/dL Final   Triglycerides  Date Value Ref Range Status  08/27/2022 76 <150 mg/dL Final         Passed - Patient is not pregnant      Passed - Valid encounter within last 12 months    Recent Outpatient Visits           2 months ago Urinary frequency   [place] [facility] [place], Angeline ORN, NP   4 months ago Episodic migraine   [facility] [place], Marsa PARAS, [place]

## 2023-09-29 NOTE — Progress Notes (Unsigned)
 Subjective:    Patient ID: Denise Jarvis, female    DOB: 2001-03-06, 23 y.o.   MRN: [id number]  HPI  Patient presents to clinic today for her annual exam.  Flu: never Tetanus: 09/2019 COVID: never Pap smear: 08/2022 Dentist: biannually  Diet: She does eat meat. She consumes fruits and veggies. She does eat some fried foods. She drinks mostly water. Exercise: Walking  Review of Systems     Past Medical History:  Diagnosis Date   Family history of adverse reaction to anesthesia    mother has severe PONV   Frequent headaches     Current Outpatient Medications  Medication Sig Dispense Refill   escitalopram  (LEXAPRO ) 5 MG tablet Take 1 tablet (5 mg total) by mouth daily. 90 tablet 0   etonogestrel -ethinyl estradiol  (NUVARING) 0.12-0.015 MG/24HR vaginal ring INSERT 1 RING VAGINALLY AND LEAVE IN PLACE FOR 3 CONSECUTIVE WEEKS, THEN REMOVE FOR 1 WEEK 3 each 1   lovastatin  (MEVACOR ) 20 MG tablet TAKE 1 TABLET(20 MG) BY MOUTH AT BEDTIME 90 tablet 3   rizatriptan  (MAXALT -MLT) 10 MG disintegrating tablet Take 1 tablet (10 mg total) by mouth as needed for migraine. May repeat in 2 hours if needed 10 tablet 2   No current facility-administered medications for this visit.    No Known Allergies  Family History  Problem Relation Age of Onset   Hypertension Mother    Depression Mother        Post-Partum Depression   Healthy Father    Migraines Neg Hx    Seizures Neg Hx    Anxiety disorder Neg Hx    Bipolar disorder Neg Hx    Schizophrenia Neg Hx    ADD / ADHD Neg Hx    Autism Neg Hx    Drug abuse Neg Hx    Suicidality Neg Hx     Social History   Socioeconomic History   Marital status: Single    Spouse name: Not on file   Number of children: Not on file   Years of education: Not on file   Highest education level: Not on file  Occupational History   Not on file  Tobacco Use   Smoking status: Never   Smokeless tobacco: Never  Vaping Use   Vaping status: Never Used   Substance and Sexual Activity   Alcohol use: No   Drug use: No   Sexual activity: Never  Other Topics Concern   Not on file  Social History Narrative   Evamae is a 10th grade student [employer]; does well in school. Noni is active in cross country, winter track and spring track.       Shelonda lives with her parents and with Delsa her baby sister (dog).       No family history of learning disabilities.    No IEP or 504 plans in school.    Social Drivers of Corporate investment banker Strain: Not on file  Food Insecurity: Not on file  Transportation Needs: Not on file  Physical Activity: Not on file  Stress: Not on file  Social Connections: Not on file  Intimate Partner Violence: Not on file     Constitutional: Patient reports intermittent headaches.  Denies fever, malaise, fatigue, or abrupt weight changes.  HEENT: Denies eye pain, eye redness, ear pain, ringing in the ears, wax buildup, runny nose, nasal congestion, bloody nose, or sore throat. Respiratory: Denies difficulty breathing, shortness of breath, cough or sputum production.   Cardiovascular: Denies  chest pain, chest tightness, palpitations or swelling in the hands or feet.  Gastrointestinal: Denies abdominal pain, bloating, constipation, diarrhea or blood in the stool.  GU: Denies urgency, frequency, pain with urination, burning sensation, blood in urine, odor or discharge. Musculoskeletal: Denies decrease in range of motion, difficulty with gait, muscle pain or joint pain and swelling.  Skin: Denies redness, rashes, lesions or ulcercations.  Neurological: Denies dizziness, difficulty with memory, difficulty with speech or problems with balance and coordination.  Psych: Patient has a history of anxiety and depression.  Denies SI/HI.  No other specific complaints in a complete review of systems (except as listed in HPI above).  Objective:   Physical Exam There were no vitals taken for this  visit.  Wt Readings from Last 3 Encounters:  07/08/23 143 lb 9.6 oz (65.1 kg)  04/27/23 140 lb (63.5 kg)  03/12/23 138 lb 3.2 oz (62.7 kg)    General: Appears her stated age, well developed, well nourished in NAD. Skin: Warm, dry and intact.  HEENT: Head: normal shape and size; Eyes: sclera white, no icterus, conjunctiva pink, PERRLA and EOMs intact;  Neck:  Neck supple, trachea midline. No masses, lumps or thyromegaly present.  Cardiovascular: Normal rate and rhythm. S1,S2 noted.  No murmur, rubs or gallops noted. No JVD or BLE edema.  Pulmonary/Chest: Normal effort and positive vesicular breath sounds. No respiratory distress. No wheezes, rales or ronchi noted.  Abdomen: Soft and nontender. Normal bowel sounds.  Pelvic: Normal female anatomy.  Cervix without mass or lesion.  Small amount of bloody discharge noted.  No CMT.  Adnexa nonpalpable. Musculoskeletal: Strength 5/5 BUE/BLE No difficulty with gait.  Neurological: Alert and oriented. Cranial nerves II-XII grossly intact. Coordination normal.  Psychiatric: Mood and affect normal. Behavior is normal. Judgment and thought content normal.     BMET    Component Value Date/Time   NA 139 08/27/2022 0830   K 4.1 08/27/2022 0830   CL 107 08/27/2022 0830   CO2 24 08/27/2022 0830   GLUCOSE 87 08/27/2022 0830   BUN 14 08/27/2022 0830   CREATININE 0.90 08/27/2022 0830   CALCIUM 9.4 08/27/2022 0830    Lipid Panel     Component Value Date/Time   CHOL 192 08/27/2022 0830   TRIG 76 08/27/2022 0830   HDL 64 08/27/2022 0830   CHOLHDL 3.0 08/27/2022 0830   VLDL 35.8 09/01/2019 1205   LDLCALC 111 (H) 08/27/2022 0830    CBC    Component Value Date/Time   WBC 8.6 08/27/2022 0830   RBC 4.71 08/27/2022 0830   HGB 14.3 08/27/2022 0830   HCT 43.4 08/27/2022 0830   PLT 236 08/27/2022 0830   MCV 92.1 08/27/2022 0830   MCH 30.4 08/27/2022 0830   MCHC 32.9 08/27/2022 0830   RDW 12.1 08/27/2022 0830   LYMPHSABS 3.2 12/06/2015 1324    MONOABS 0.8 12/06/2015 1324   EOSABS 0.1 12/06/2015 1324   BASOSABS 0.0 12/06/2015 1324    Hgb A1C Lab Results  Component Value Date   HGBA1C 5.3 08/27/2022             Assessment & Plan:   Preventative Health Maintenance:  Encouraged her to get a flu shot in the fall Tetanus UTD Encouraged her to get her COVID-vaccine Pap smear UTD Encouraged her to consume a balanced diet and exercise regimen Advised her to see a dentist annually We will check CBC, c-Met, lipid, A1c today  RTC in 6 months, follow-up chronic conditions Angeline Laura,  NP

## 2023-09-30 ENCOUNTER — Encounter: Payer: Self-pay | Admitting: Internal Medicine

## 2023-09-30 ENCOUNTER — Ambulatory Visit (INDEPENDENT_AMBULATORY_CARE_PROVIDER_SITE_OTHER): Admitting: Internal Medicine

## 2023-09-30 VITALS — BP 110/70 | Ht 63.0 in | Wt 150.2 lb

## 2023-09-30 DIAGNOSIS — E663 Overweight: Secondary | ICD-10-CM | POA: Diagnosis not present

## 2023-09-30 DIAGNOSIS — Z6828 Body mass index (BMI) 28.0-28.9, adult: Secondary | ICD-10-CM | POA: Insufficient documentation

## 2023-09-30 DIAGNOSIS — Z0001 Encounter for general adult medical examination with abnormal findings: Secondary | ICD-10-CM | POA: Diagnosis not present

## 2023-09-30 DIAGNOSIS — Z111 Encounter for screening for respiratory tuberculosis: Secondary | ICD-10-CM | POA: Diagnosis not present

## 2023-09-30 DIAGNOSIS — Z6826 Body mass index (BMI) 26.0-26.9, adult: Secondary | ICD-10-CM

## 2023-09-30 NOTE — Patient Instructions (Signed)

## 2023-09-30 NOTE — Assessment & Plan Note (Signed)
 Encouraged diet and exercise for weight loss ?

## 2023-10-02 ENCOUNTER — Ambulatory Visit

## 2023-10-02 LAB — TB SKIN TEST
Induration: 0 mm
TB Skin Test: NEGATIVE

## 2023-10-02 NOTE — Progress Notes (Signed)
Patient came in for PPD reading. Results negative

## 2023-10-29 ENCOUNTER — Encounter

## 2023-10-29 ENCOUNTER — Other Ambulatory Visit: Payer: Self-pay | Admitting: Internal Medicine

## 2023-10-30 ENCOUNTER — Ambulatory Visit: Admitting: Internal Medicine

## 2023-10-30 ENCOUNTER — Encounter: Payer: Self-pay | Admitting: Internal Medicine

## 2023-10-30 VITALS — BP 112/74 | Ht 63.0 in | Wt 152.0 lb

## 2023-10-30 DIAGNOSIS — G43009 Migraine without aura, not intractable, without status migrainosus: Secondary | ICD-10-CM | POA: Diagnosis not present

## 2023-10-30 MED ORDER — DIPHENHYDRAMINE HCL 25 MG PO CAPS
50.0000 mg | ORAL_CAPSULE | Freq: Once | ORAL | Status: AC
Start: 2023-10-30 — End: 2023-10-30
  Administered 2023-10-30: 50 mg via ORAL

## 2023-10-30 MED ORDER — KETOROLAC TROMETHAMINE 30 MG/ML IJ SOLN
30.0000 mg | Freq: Once | INTRAMUSCULAR | Status: AC
  Administered 2023-10-30: 30 mg via INTRAMUSCULAR

## 2023-10-30 MED ORDER — SUMATRIPTAN SUCCINATE 100 MG PO TABS: 100.0000 mg | ORAL_TABLET | ORAL | 0 refills | Status: DC | PRN

## 2023-10-30 MED ORDER — ONDANSETRON HCL 4 MG/2ML IJ SOLN
4.0000 mg | Freq: Once | INTRAMUSCULAR | Status: AC
  Administered 2023-10-30: 4 mg via INTRAMUSCULAR

## 2023-10-30 NOTE — Progress Notes (Signed)
 Subjective:    Patient ID: Denise Jarvis, female    DOB: 2000-06-04, 23 y.o.   MRN: [id number]  HPI  Discussed the use of AI scribe software for clinical note transcription with the patient, who gave verbal consent to proceed.  Denise Jarvis is a 23 year old female with migraines who presents with an acute exacerbation of migraine symptoms.  She experiences frequent migraines, particularly at the beginning of the month, which she associates with her menstrual cycle. However, she is currently experiencing a severe migraine in the middle of the month, which she cannot attribute to any specific trigger. The current migraine has persisted for approximately 48 hours.  The migraine is primarily located on the left side of her head, with the most intense pain at the crease of her eye. The pain is severe and unresponsive to her usual medications. She has been taking sumatriptan  (Imitrex ) and has also tried Tylenol  and Excedrin, but none have provided relief. She is not currently taking Topamax  and is not taking Maxalt , which is listed in her medication list.  She has associated symptoms of photophobia, phonophobia, nausea, and vomiting. Due to the severity of her symptoms, she did not drive to the appointment.       Review of Systems     Past Medical History:  Diagnosis Date   Family history of adverse reaction to anesthesia    mother has severe PONV   Frequent headaches     Current Outpatient Medications  Medication Sig Dispense Refill   escitalopram  (LEXAPRO ) 5 MG tablet Take 1 tablet (5 mg total) by mouth daily. 90 tablet 0   etonogestrel -ethinyl estradiol  (NUVARING) 0.12-0.015 MG/24HR vaginal ring INSERT 1 RING VAGINALLY AND LEAVE IN PLACE FOR 3 CONSECUTIVE WEEKS, THEN REMOVE FOR 1 WEEK 3 each 1   lovastatin  (MEVACOR ) 20 MG tablet TAKE 1 TABLET(20 MG) BY MOUTH AT BEDTIME 90 tablet 3   rizatriptan  (MAXALT -MLT) 10 MG disintegrating tablet Take 1 tablet (10 mg total) by mouth as needed  for migraine. May repeat in 2 hours if needed 10 tablet 2   SUMAtriptan  (IMITREX ) 50 MG tablet Take 50 mg by mouth.     No current facility-administered medications for this visit.    No Known Allergies  Family History  Problem Relation Age of Onset   Hypertension Mother    Depression Mother        Post-Partum Depression   Healthy Father    Migraines Neg Hx    Seizures Neg Hx    Anxiety disorder Neg Hx    Bipolar disorder Neg Hx    Schizophrenia Neg Hx    ADD / ADHD Neg Hx    Autism Neg Hx    Drug abuse Neg Hx    Suicidality Neg Hx     Social History   Socioeconomic History   Marital status: Single    Spouse name: Not on file   Number of children: Not on file   Years of education: Not on file   Highest education level: Not on file  Occupational History   Not on file  Tobacco Use   Smoking status: Never   Smokeless tobacco: Never  Vaping Use   Vaping status: Never Used  Substance and Sexual Activity   Alcohol use: No   Drug use: No   Sexual activity: Never  Other Topics Concern   Not on file  Social History Narrative   Denise Jarvis is a 10th grade student [employer]; does well in  school. Denise Jarvis is active in cross country, winter track and spring track.       Denise Jarvis lives with her parents and with Delsa her baby sister (dog).       No family history of learning disabilities.    No IEP or 504 plans in school.    Social Drivers of Corporate investment banker Strain: Not on file  Food Insecurity: Not on file  Transportation Needs: Not on file  Physical Activity: Not on file  Stress: Not on file  Social Connections: Not on file  Intimate Partner Violence: Not on file     Constitutional: Patient reports intermittent headaches.  Denies fever, malaise, fatigue, or abrupt weight changes.  HEENT: Denies eye pain, eye redness, ear pain, ringing in the ears, wax buildup, runny nose, nasal congestion, bloody nose, or sore throat. Respiratory: Denies  difficulty breathing, shortness of breath, cough or sputum production.   Cardiovascular: Denies chest pain, chest tightness, palpitations or swelling in the hands or feet.  Gastrointestinal: Patient reports nausea.  Denies abdominal pain, bloating, constipation, diarrhea or blood in the stool.  GU: Denies urgency, frequency, pain with urination, burning sensation, blood in urine, odor or discharge. Musculoskeletal: Denies decrease in range of motion, difficulty with gait, muscle pain or joint pain and swelling.  Skin: Denies redness, rashes, lesions or ulcercations.  Neurological: Patient reports sensitivity to light and sound.  Denies dizziness, difficulty with memory, difficulty with speech or problems with balance and coordination.  Psych: Patient has a history of anxiety and depression.  Denies SI/HI.  No other specific complaints in a complete review of systems (except as listed in HPI above).  Objective:   Physical Exam BP 112/74 (BP Location: Right Arm, Patient Position: Sitting, Cuff Size: Normal)   Ht 5' 3 (1.6 m)   Wt 152 lb (68.9 kg)   LMP 10/21/2023 (Approximate)   BMI 26.93 kg/m     Wt Readings from Last 3 Encounters:  09/30/23 150 lb 3.2 oz (68.1 kg)  07/08/23 143 lb 9.6 oz (65.1 kg)  04/27/23 140 lb (63.5 kg)    General: Appears her stated age, appears uncomfortable but in NAD. Skin: Warm, dry and intact.  HEENT: Head: normal shape and size; Eyes: sclera white, no icterus, conjunctiva pink, PERRLA and EOMs intact;  Cardiovascular: Normal rate. Pulmonary/Chest: Normal effort and positive vesicular breath sounds. No respiratory distress.  Neurological: Alert and oriented. Coordination normal.    BMET    Component Value Date/Time   NA 139 08/27/2022 0830   K 4.1 08/27/2022 0830   CL 107 08/27/2022 0830   CO2 24 08/27/2022 0830   GLUCOSE 87 08/27/2022 0830   BUN 14 08/27/2022 0830   CREATININE 0.90 08/27/2022 0830   CALCIUM 9.4 08/27/2022 0830    Lipid  Panel     Component Value Date/Time   CHOL 192 08/27/2022 0830   TRIG 76 08/27/2022 0830   HDL 64 08/27/2022 0830   CHOLHDL 3.0 08/27/2022 0830   VLDL 35.8 09/01/2019 1205   LDLCALC 111 (H) 08/27/2022 0830    CBC    Component Value Date/Time   WBC 8.6 08/27/2022 0830   RBC 4.71 08/27/2022 0830   HGB 14.3 08/27/2022 0830   HCT 43.4 08/27/2022 0830   PLT 236 08/27/2022 0830   MCV 92.1 08/27/2022 0830   MCH 30.4 08/27/2022 0830   MCHC 32.9 08/27/2022 0830   RDW 12.1 08/27/2022 0830   LYMPHSABS 3.2 12/06/2015 1324   MONOABS 0.8 12/06/2015  1324   EOSABS 0.1 12/06/2015 1324   BASOSABS 0.0 12/06/2015 1324    Hgb A1C Lab Results  Component Value Date   HGBA1C 5.3 08/27/2022             Assessment & Plan:   Assessment and Plan    Migraine, unspecified, not intractable, without status migrainosus Experiences frequent migraines, often menstrual-related, with current severe episode unresponsive to sumatriptan , Tylenol , and Excedrin. Considering changes in acute and preventive treatment. - Administer Toradol  30 mg injection for acute relief. - Administer Benadryl  50 mg p.o. and Zofran  4 mg injection for nausea and symptom relief. - Provide Ubrelvy  100 mg samples for acute treatment; advise trial of 0.5-1 tablet over weekend or as needed. - Refill sumatriptan  in case Ubrelvy  is ineffective; advise Ubrelvy  trial first for future episodes. - Provide Qulipta 60 mg samples for daily preventive use. - Advise rest and adequate hydration. - Discussed stress as a potential migraine trigger.       RTC in 5 months, follow-up chronic conditions Angeline Laura, NP

## 2023-10-30 NOTE — Patient Instructions (Signed)
 Migraine Headache A migraine headache is a very strong throbbing pain on one or both sides of your head. This type of headache can also cause other symptoms. It can last from 4 hours to 3 days. Talk with your doctor about what things may bring on (trigger) this condition. What are the causes? The exact cause of a migraine is not known. This condition may be brought on or caused by: Smoking. Medicines, such as: Medicine used to treat chest pain (nitroglycerin). Birth control pills. Estrogen. Some blood pressure medicines. Certain substances in some foods or drinks. Foods and drinks, such as: Cheese. Chocolate. Alcohol. Caffeine. Doing physical activity that is very hard. Other things that may trigger a migraine headache include: Periods. Pregnancy. Hunger. Stress. Getting too much or too little sleep. Weather changes. Feeling tired (fatigue). What increases the risk? Being 18-65 years old. Being female. Having a family history of migraine headaches. Being Caucasian. Having a mental health condition, such as being sad (depressed) or feeling worried or nervous (anxious). Being very overweight (obese). What are the signs or symptoms? A throbbing pain. This pain may: Happen in any area of the head, such as on one or both sides. Make it hard to do daily activities. Get worse with physical activity. Get worse around bright lights, loud noises, or smells. Other symptoms may include: Feeling like you may vomit (nauseous). Vomiting. Dizziness. Before a migraine headache starts, you may get warning signs (an aura). An aura may include: Seeing flashing lights or having blind spots. Seeing bright spots, halos, or zigzag lines. Having tunnel vision or blurred vision. Having numbness or a tingling feeling. Having trouble talking. Having weak muscles. After a migraine ends, you may have symptoms. These may include: Tiredness. Trouble thinking (concentrating). How is this  treated? Taking medicines that: Relieve pain. Relieve the feeling like you may vomit. Prevent migraine headaches. Treatment may also include: Acupuncture. Lifestyle changes like avoiding foods that bring on migraine headaches. Learning ways to control your body functions (biofeedback). Therapy to help you know and deal with negative thoughts (cognitive behavioral therapy). Follow these instructions at home: Medicines Take over-the-counter and prescription medicines only as told by your doctor. If told, take steps to prevent problems with pooping (constipation). You may need to: Drink enough fluid to keep your pee (urine) pale yellow. Take medicines. You will be told what medicines to take. Eat foods that are high in fiber. These include beans, whole grains, and fresh fruits and vegetables. Limit foods that are high in fat and sugar. These include fried or sweet foods. Ask your doctor if you should avoid driving or using machines while you are taking your medicine. Lifestyle  Do not drink alcohol. Do not smoke or use any products that contain nicotine or tobacco. If you need help quitting, ask your doctor. Get 7-9 hours of sleep each night, or the amount recommended by your doctor. Find ways to deal with stress, such as meditation, deep breathing, or yoga. Try to exercise often. This can help lessen how bad and how often your migraines happen. General instructions Keep a journal to find out what may bring on your migraine headaches. This can help you avoid those things. For example, write down: What you eat and drink. How much sleep you get. Any change to your medicines or diet. If you have a migraine headache: Avoid things that make your symptoms worse, such as bright lights. Lie down in a dark, quiet room. Do not drive or use machinery. Ask your  doctor what activities are safe for you. Where to find more information Coalition for Headache and Migraine Patients (CHAMP):  headachemigraine.org American Migraine Foundation: americanmigrainefoundation.org National Headache Foundation: headaches.org Contact a doctor if: You get a migraine headache that is different or worse than others you have had. You have more than 15 days of headaches in one month. Get help right away if: Your migraine headache gets very bad. Your migraine headache lasts more than 72 hours. You have a fever or stiff neck. You have trouble seeing. Your muscles feel weak or like you cannot control them. You lose your balance a lot. You have trouble walking. You faint. You have a seizure. This information is not intended to replace advice given to you by your health care provider. Make sure you discuss any questions you have with your health care provider. Document Revised: 10/28/2021 Document Reviewed: 10/28/2021 Elsevier Patient Education  2024 [employer].

## 2023-11-02 NOTE — Telephone Encounter (Signed)
 Requested medication (s) are due for refill today -no  Requested medication (s) are on the active medication list -no  Future visit scheduled -yes  Last refill: no longer on current medication list  Notes to clinic: off protocol- provider review   Requested Prescriptions  Pending Prescriptions Disp Refills   NURTEC 75 MG TBDP [Pharmacy Med Name: NURTEC 75MG  ODT TABLETS] 8 tablet 2    Sig: DISSOLVE 1 TABLET(75 MG) ON THE TONGUE DAILY AS NEEDED FOR MIGRAINE HEADACHE. MAX 1 TABLET IN 24 HOURS     Off-Protocol Failed - 11/02/2023  1:48 PM      Failed - Medication not assigned to a protocol, review manually.      Passed - Valid encounter within last 12 months    Recent Outpatient Visits           3 days ago Migraine without aura and without status migrainosus, not intractable   [place] [facility] [place], Angeline ORN, NP   1 month ago Encounter for general adult medical examination with abnormal findings   [place] [facility] [place], Angeline ORN, NP   3 months ago Urinary frequency   [place] [facility] [place], Angeline ORN, NP   6 months ago Episodic migraine   [place] [facility] [place], Marsa PARAS, DO              Refused Prescriptions Disp Refills   SUMAtriptan  (IMITREX ) 50 MG tablet [Pharmacy Med Name: SUMATRIPTAN  50MG  TABLETS] 10 tablet     Sig: TAKE 1 TABLET BY MOUTH EVERY 2 HOURS AS NEEDED FOR MIGRAINE. MAY REPEAT IN 2 HOURS IF HEADACHE PERSISTS OR RECURS     Neurology:  Migraine Therapy - Triptan Passed - 11/02/2023  1:48 PM      Passed - Last BP in normal range    BP Readings from Last 1 Encounters:  10/30/23 112/74         Passed - Valid encounter within last 12 months    Recent Outpatient Visits           3 days ago Migraine without aura and without status migrainosus, not intractable   [place] [facility] [place], Angeline ORN, NP   1 month ago Encounter for general  adult medical examination with abnormal findings   [place] [facility] [place], Angeline ORN, NP   3 months ago Urinary frequency   [place] [facility] [place], Angeline ORN, NP   6 months ago Episodic migraine   [place] [facility] [place] Marsa PARAS, DO                 Requested Prescriptions  Pending Prescriptions Disp Refills   NURTEC 75 MG TBDP [Pharmacy Med Name: NURTEC 75MG  ODT TABLETS] 8 tablet 2    Sig: DISSOLVE 1 TABLET(75 MG) ON THE TONGUE DAILY AS NEEDED FOR MIGRAINE HEADACHE. MAX 1 TABLET IN 24 HOURS     Off-Protocol Failed - 11/02/2023  1:48 PM      Failed - Medication not assigned to a protocol, review manually.      Passed - Valid encounter within last 12 months    Recent Outpatient Visits           3 days ago Migraine without aura and without status migrainosus, not intractable   [place] [facility] [place], Angeline ORN, NP   1 month ago Encounter  for general adult medical examination with abnormal findings   [place] [facility] [place], Angeline ORN, NP   3 months ago Urinary frequency   [place] [facility] [place], Angeline ORN, NP   6 months ago Episodic migraine   [place] [facility] [place], Marsa PARAS, DO              Refused Prescriptions Disp Refills   SUMAtriptan  (IMITREX ) 50 MG tablet [Pharmacy Med Name: SUMATRIPTAN  50MG  TABLETS] 10 tablet     Sig: TAKE 1 TABLET BY MOUTH EVERY 2 HOURS AS NEEDED FOR MIGRAINE. MAY REPEAT IN 2 HOURS IF HEADACHE PERSISTS OR RECURS     Neurology:  Migraine Therapy - Triptan Passed - 11/02/2023  1:48 PM      Passed - Last BP in normal range    BP Readings from Last 1 Encounters:  10/30/23 112/74         Passed - Valid encounter within last 12 months    Recent Outpatient Visits           3 days ago Migraine without aura and without status migrainosus, not intractable   [place] [facility] [place], Angeline ORN, NP   1 month ago Encounter for general adult medical examination with abnormal findings   [place] [facility] [place], Angeline ORN, NP   3 months ago Urinary frequency   [place] [facility] [place], Angeline ORN, NP   6 months ago Episodic migraine    [place] [facility] [place], Marsa PARAS, [place]

## 2023-11-02 NOTE — Telephone Encounter (Signed)
 No longer current dosing of this medication Requested Prescriptions  Pending Prescriptions Disp Refills   NURTEC 75 MG TBDP [Pharmacy Med Name: NURTEC 75MG  ODT TABLETS] 8 tablet 2    Sig: DISSOLVE 1 TABLET(75 MG) ON THE TONGUE DAILY AS NEEDED FOR MIGRAINE HEADACHE. MAX 1 TABLET IN 24 HOURS     Off-Protocol Failed - 11/02/2023  1:47 PM      Failed - Medication not assigned to a protocol, review manually.      Passed - Valid encounter within last 12 months    Recent Outpatient Visits           3 days ago Migraine without aura and without status migrainosus, not intractable   [place] [facility] [place], Angeline ORN, NP   1 month ago Encounter for general adult medical examination with abnormal findings   [place] [facility] [place], Angeline ORN, NP   3 months ago Urinary frequency   [place] [facility] [place], Angeline ORN, NP   6 months ago Episodic migraine   [place] [facility] [place], Marsa PARAS, DO              Refused Prescriptions Disp Refills   SUMAtriptan  (IMITREX ) 50 MG tablet [Pharmacy Med Name: SUMATRIPTAN  50MG  TABLETS] 10 tablet     Sig: TAKE 1 TABLET BY MOUTH EVERY 2 HOURS AS NEEDED FOR MIGRAINE. MAY REPEAT IN 2 HOURS IF HEADACHE PERSISTS OR RECURS     Neurology:  Migraine Therapy - Triptan Passed - 11/02/2023  1:47 PM      Passed - Last BP in normal range    BP Readings from Last 1 Encounters:  10/30/23 112/74         Passed - Valid encounter within last 12 months    Recent Outpatient Visits           3 days ago Migraine without aura and without status migrainosus, not intractable   [place] [facility] [place], Angeline ORN, NP   1 month ago Encounter for general adult medical examination with abnormal findings   [place] [facility] [place], Angeline ORN, NP   3 months ago Urinary frequency   [place] [facility] [place], Angeline ORN, NP   6  months ago Episodic migraine    [facility] [place], Marsa PARAS, [place]

## 2023-11-29 ENCOUNTER — Other Ambulatory Visit: Payer: Self-pay | Admitting: Internal Medicine

## 2023-12-01 ENCOUNTER — Encounter: Payer: Self-pay | Admitting: Internal Medicine

## 2023-12-01 ENCOUNTER — Other Ambulatory Visit: Payer: Self-pay

## 2023-12-01 MED ORDER — ESCITALOPRAM OXALATE 5 MG PO TABS: 5.0000 mg | ORAL_TABLET | Freq: Every day | ORAL | 0 refills | Status: DC

## 2023-12-25 ENCOUNTER — Other Ambulatory Visit: Payer: Self-pay

## 2023-12-25 ENCOUNTER — Encounter: Payer: Self-pay | Admitting: Internal Medicine

## 2023-12-25 MED ORDER — SUMATRIPTAN SUCCINATE 100 MG PO TABS: 100.0000 mg | ORAL_TABLET | ORAL | 0 refills | Status: DC | PRN

## 2023-12-31 ENCOUNTER — Other Ambulatory Visit: Payer: Self-pay | Admitting: Internal Medicine

## 2024-01-01 ENCOUNTER — Other Ambulatory Visit: Payer: Self-pay | Admitting: Internal Medicine

## 2024-01-01 NOTE — Telephone Encounter (Signed)
 Requested Prescriptions  Pending Prescriptions Disp Refills   etonogestrel -ethinyl estradiol  (NUVARING) 0.12-0.015 MG/24HR vaginal ring [Pharmacy Med Name: ETONOGESTREL  ETHINYL EST VAG RING] 3 each 2    Sig: INSERT 1 RING VAGINALLY AND LEAVE IN PLACE FOR 3 WEEKS CONSECUTIVELY, THEN REMOVE FOR 1 WEEK     OB/GYN:  Contraceptives Passed - 01/01/2024  2:28 PM      Passed - Last BP in normal range    BP Readings from Last 1 Encounters:  10/30/23 112/74         Passed - Valid encounter within last 12 months    Recent Outpatient Visits           2 months ago Migraine without aura and without status migrainosus, not intractable   [facility] [place], Angeline ORN, NP   3 months ago Encounter for general adult medical examination with abnormal findings   [place] [facility] [place], Angeline ORN, NP   5 months ago Urinary frequency   [place] [facility] [place], Angeline ORN, NP   8 months ago Episodic migraine   [place] [facility] [place] Marsa PARAS, [place] - Patient is not a smoker

## 2024-01-04 NOTE — Telephone Encounter (Signed)
 Too soon for refill, duplicate request.  Requested Prescriptions  Pending Prescriptions Disp Refills   etonogestrel -ethinyl estradiol  (NUVARING) 0.12-0.015 MG/24HR vaginal ring [Pharmacy Med Name: NUVARING] 3 each 1    Sig: INSERT 1 RING VAGINALLY FOR 3 CONSECUTIVE WEEKS THEN REMOVE FOR 1 WEEK     OB/GYN:  Contraceptives Passed - 01/04/2024 11:24 AM      Passed - Last BP in normal range    BP Readings from Last 1 Encounters:  10/30/23 112/74         Passed - Valid encounter within last 12 months    Recent Outpatient Visits           2 months ago Migraine without aura and without status migrainosus, not intractable   [place] [facility] [place], Angeline ORN, NP   3 months ago Encounter for general adult medical examination with abnormal findings   [place] [facility] [place], Angeline ORN, NP   6 months ago Urinary frequency   [place] [facility] [place], Angeline ORN, NP   8 months ago Episodic migraine   [place] [facility] [place] Marsa PARAS, [place] - Patient is not a smoker

## 2024-01-15 ENCOUNTER — Encounter: Payer: Self-pay | Admitting: Internal Medicine

## 2024-01-15 MED ORDER — SUMATRIPTAN SUCCINATE 100 MG PO TABS: 100.0000 mg | ORAL_TABLET | ORAL | 0 refills | Status: DC | PRN

## 2024-01-26 ENCOUNTER — Encounter: Payer: Self-pay | Admitting: Internal Medicine

## 2024-01-26 ENCOUNTER — Ambulatory Visit: Admitting: Internal Medicine

## 2024-01-26 VITALS — BP 122/74 | Ht 63.0 in | Wt 165.6 lb

## 2024-01-26 DIAGNOSIS — Z6829 Body mass index (BMI) 29.0-29.9, adult: Secondary | ICD-10-CM

## 2024-01-26 DIAGNOSIS — G43839 Menstrual migraine, intractable, without status migrainosus: Secondary | ICD-10-CM | POA: Diagnosis not present

## 2024-01-26 DIAGNOSIS — F419 Anxiety disorder, unspecified: Secondary | ICD-10-CM

## 2024-01-26 DIAGNOSIS — R635 Abnormal weight gain: Secondary | ICD-10-CM | POA: Diagnosis not present

## 2024-01-26 DIAGNOSIS — F32A Depression, unspecified: Secondary | ICD-10-CM

## 2024-01-26 MED ORDER — PHENTERMINE HCL 15 MG PO CAPS: 15.0000 mg | ORAL_CAPSULE | ORAL | 0 refills | Status: DC

## 2024-01-26 MED ORDER — SUMATRIPTAN SUCCINATE 100 MG PO TABS: 100.0000 mg | ORAL_TABLET | ORAL | 0 refills | Status: DC | PRN

## 2024-01-26 NOTE — Patient Instructions (Signed)
 High-Protein and High-Calorie Diet Eating high-protein and high-calorie foods can help you to gain weight, heal after an injury, and get better after an illness or surgery. The amount of daily protein and calories you need depends on: Your body weight. The reason you were told to follow this diet. Usually, a high-protein, high-calorie diet means that you should: Eat 250-500 extra calories each day. Make sure that you get enough of your daily calories from protein. Ask your health care provider how many of your calories should come from protein and how many calories total you need each day. Follow the diet as told by your provider. What are tips for following this plan? Reading food labels Check the nutrition facts label for calories, and grams of fat and protein. Items with more than 4 grams of protein are high-protein foods. General information  Ask your provider if you should take a nutritional supplement. Try to eat six small meals each day instead of three large meals. A goal is usually to eat every 2 to 3 hours. Eat a balanced diet. In each meal, include one food that's high in protein and one food with fat in it. Keep nutritious snacks available, such as nuts, trail mixes, dried fruit, and whole-milk yogurt. If you have kidney disease or diabetes, talk with your provider about how much protein is safe for you. Too much protein may put extra stress on your kidneys. Replace zero-calorie drinks with drinks that have calories in them, such as milk and 100% fruit juice. Consider setting a timer to remind you to eat. You'll want to eat even if you do not feel very hungry. Preparing meals Milk and dairy foods. Add whole milk, half-and-half, or heavy cream to cereal, pudding, soup, or hot cocoa. Add whole milk to instant breakfast drinks. Add powdered milk to baked goods, smoothies, or milkshakes. Add powdered milk, cream, or butter to mashed potatoes. Replace water with milk or heavy cream  when making foods such as oatmeal, pudding, or cocoa. Make cream-based pastas and soups. Add cheese to cooked vegetables. Make whole-milk yogurt parfaits. Top them with granola, fruit, or nuts. Add cottage cheese to fruit. Add cream cheese to sandwiches or as a topping on crackers and bread. Eggs. Add hard-boiled eggs to salads. Keep hard-boiled eggs in the fridge to snack on. Add cheese to cooked eggs. Beans, nuts, and seeds. Add peanut butter to oatmeal or smoothies. Use peanut butter as a dip for fruits and vegetables or as a topping for pretzels, celery, or crackers. Add beans to casseroles, dips, and spreads. Add pureed beans to sauces and soups. Salads, soups, and other foods. Add avocado, cheese, or both to sandwiches or salads. Add avocado to smoothies. Add meat, poultry, or seafood to rice, pasta, casseroles, salads, and soups. Use mayonnaise when making egg salad, chicken salad, or tuna salad. Add oil or butter to cooked vegetables and grains. What high-protein foods should I eat?  Vegetables Soybeans. Peas. Grains Quinoa. Bulgur wheat. Buckwheat. Meats and other proteins Beef, pork, and poultry. Fish and seafood. Eggs. Tofu. Textured vegetable protein (TVP). Peanut butter. Nuts and seeds. Dried beans. Protein powders. Hummus. Jerky. Dairy Whole milk. Whole-milk yogurt. Powdered milk. Cheese. Cottage cheese. Eggnog. Beverages High-protein supplement drinks. Soy milk. Other foods Protein bars. The items listed above may not be all the foods and drinks you can have. Talk with an expert in healthy eating called a dietitian to learn more. What high-calorie foods should I eat? Fruits Dried fruit. Fruit leather. Canned  fruit in syrup. Fruit juice. Avocado. Vegetables Vegetables cooked in oil or butter. Fried potatoes. Grains Pasta. Quick breads. Muffins. Pancakes. Granola. Meats and other proteins Peanut butter and other nut butters. Nuts and seeds. Dairy Heavy cream.  Whipped cream. Cream cheese. Sour cream. Ice cream. Custard. Pudding. Whole-milk dairy products. Beverages Meal-replacement beverages. Nutrition shakes. Fruit juice. Seasonings and condiments Salad dressing. Mayonnaise. Alfredo sauce. Fruit preserves or jelly. Honey. Syrup. Sweets and desserts Cake. Cookies. Pie. Pastries. Candy bars. Chocolate. Fats and oils Butter or margarine. Oil. Gravy. Other foods Meal-replacement bars. The items listed above may not be all the foods and drinks you can have. Talk with an expert in healthy eating to learn more. This information is not intended to replace advice given to you by your health care provider. Make sure you discuss any questions you have with your health care provider. Document Revised: 07/28/2022 Document Reviewed: 07/28/2022 Elsevier Patient Education  2024 [employer].

## 2024-01-26 NOTE — Addendum Note (Signed)
 Addended by: ANTONETTE ANGELINE ORN on: 01/26/2024 02:31 PM   Modules accepted: Level of Service

## 2024-01-26 NOTE — Progress Notes (Signed)
 Subjective:    Patient ID: Denise Jarvis, female    DOB: 14-Aug-2000, 23 y.o.   MRN: [id number]  HPI  Discussed the use of AI scribe software for clinical note transcription with the patient, who gave verbal consent to proceed.  Denise Jarvis is a 23 year old female who presents with concerns about rapid weight gain.  She has experienced a significant weight gain of nearly 30 pounds over the past year, increasing from 138 pounds to 165 pounds. She attributes this to potential lifestyle factors such as stress, diet, and reduced physical activity. She recently started a 1500 calorie diet similar to her mother's and has lost about 4 pounds.  She has been on escitalopram  for anxiety and depression for approximately two years.  She does feel like her mood is controlled and she does not feel like the escitalopram  is causing her weight gain.  Her dietary habits include consuming Canadian bacon and a hard-boiled egg for breakfast, yogurt or a cheese stick for a mid-morning snack, hummus and carrots with chicken for lunch, almonds for an afternoon snack, and chicken with broccoli for dinner. She describes herself as a 'snacker' and enjoys sweets.  She is currently on sumatriptan  for headaches, which worsen during her menstrual periods, causing daily headaches for about a week each month.  She has an upcoming appointment with neurology but would like a refill of the sumatriptan  today.  No family history of thyroid issues. She denies symptoms suggestive of thyroid dysfunction, such as fatigue, cold intolerance, dry skin, constipation, or depression.       Review of Systems     Past Medical History:  Diagnosis Date   Family history of adverse reaction to anesthesia    mother has severe PONV   Frequent headaches     Current Outpatient Medications  Medication Sig Dispense Refill   escitalopram  (LEXAPRO ) 5 MG tablet Take 1 tablet (5 mg total) by mouth daily. 90 tablet 0   etonogestrel -ethinyl  estradiol  (NUVARING) 0.12-0.015 MG/24HR vaginal ring INSERT 1 RING VAGINALLY AND LEAVE IN PLACE FOR 3 WEEKS CONSECUTIVELY, THEN REMOVE FOR 1 WEEK 3 each 2   lovastatin  (MEVACOR ) 20 MG tablet TAKE 1 TABLET(20 MG) BY MOUTH AT BEDTIME 90 tablet 3   SUMAtriptan  (IMITREX ) 100 MG tablet Take 1 tablet (100 mg total) by mouth every 2 (two) hours as needed for migraine. May repeat in 2 hours if headache persists or recurs. 10 tablet 0   No current facility-administered medications for this visit.    No Known Allergies  Family History  Problem Relation Age of Onset   Hypertension Mother    Depression Mother        Post-Partum Depression   Healthy Father    Migraines Neg Hx    Seizures Neg Hx    Anxiety disorder Neg Hx    Bipolar disorder Neg Hx    Schizophrenia Neg Hx    ADD / ADHD Neg Hx    Autism Neg Hx    Drug abuse Neg Hx    Suicidality Neg Hx     Social History   Socioeconomic History   Marital status: Single    Spouse name: Not on file   Number of children: Not on file   Years of education: Not on file   Highest education level: Not on file  Occupational History   Not on file  Tobacco Use   Smoking status: Never   Smokeless tobacco: Never  Vaping Use   Vaping status:  Never Used  Substance and Sexual Activity   Alcohol use: No   Drug use: No   Sexual activity: Never  Other Topics Concern   Not on file  Social History Narrative   Taziyah is a 10th grade student [employer]; does well in school. Azia is active in cross country, winter track and spring track.       Channie lives with her parents and with Delsa her baby sister (dog).       No family history of learning disabilities.    No IEP or 504 plans in school.    Social Drivers of Corporate Investment Banker Strain: Not on file  Food Insecurity: Not on file  Transportation Needs: Not on file  Physical Activity: Not on file  Stress: Not on file  Social Connections: Not on file  Intimate Partner  Violence: Not on file     Constitutional: Patient reports intermittent headaches, difficulty losing weight.  Denies fever, malaise, fatigue, or abrupt weight changes.  HEENT: Denies eye pain, eye redness, ear pain, ringing in the ears, wax buildup, runny nose, nasal congestion, bloody nose, or sore throat. Respiratory: Denies difficulty breathing, shortness of breath, cough or sputum production.   Cardiovascular: Denies chest pain, chest tightness, palpitations or swelling in the hands or feet.  Musculoskeletal: Denies decrease in range of motion, difficulty with gait, muscle pain or joint pain and swelling.  Skin: Denies redness, rashes, lesions or ulcercations.  Neurological: Denies dizziness, difficulty with memory, difficulty with speech or problems with balance and coordination.  Psych: Patient has a history of anxiety and depression.  Denies SI/HI.  No other specific complaints in a complete review of systems (except as listed in HPI above).  Objective:   Physical Exam BP 122/74 (BP Location: Right Arm, Patient Position: Sitting, Cuff Size: Normal)   Ht 5' 3 (1.6 m)   Wt 165 lb 9.6 oz (75.1 kg)   LMP 01/22/2024 (Approximate)   BMI 29.33 kg/m      Wt Readings from Last 3 Encounters:  10/30/23 152 lb (68.9 kg)  09/30/23 150 lb 3.2 oz (68.1 kg)  07/08/23 143 lb 9.6 oz (65.1 kg)    General: Appears her stated age, overweight, in NAD. Skin: Warm, dry and intact.  HEENT: Head: normal shape and size; Eyes: sclera white, no icterus, conjunctiva pink, PERRLA and EOMs intact;  Cardiovascular: Normal rate and rhythm. Pulmonary/Chest: Normal effort and positive vesicular breath sounds.  Musculoskeletal: No difficulty with gait.  Neurological: Alert and oriented. Coordination normal.  Psychiatric: Mood and affect normal. Behavior is normal. Judgment and thought content normal.     BMET    Component Value Date/Time   NA 139 08/27/2022 0830   K 4.1 08/27/2022 0830   CL 107  08/27/2022 0830   CO2 24 08/27/2022 0830   GLUCOSE 87 08/27/2022 0830   BUN 14 08/27/2022 0830   CREATININE 0.90 08/27/2022 0830   CALCIUM 9.4 08/27/2022 0830    Lipid Panel     Component Value Date/Time   CHOL 192 08/27/2022 0830   TRIG 76 08/27/2022 0830   HDL 64 08/27/2022 0830   CHOLHDL 3.0 08/27/2022 0830   VLDL 35.8 09/01/2019 1205   LDLCALC 111 (H) 08/27/2022 0830    CBC    Component Value Date/Time   WBC 8.6 08/27/2022 0830   RBC 4.71 08/27/2022 0830   HGB 14.3 08/27/2022 0830   HCT 43.4 08/27/2022 0830   PLT 236 08/27/2022 0830  MCV 92.1 08/27/2022 0830   MCH 30.4 08/27/2022 0830   MCHC 32.9 08/27/2022 0830   RDW 12.1 08/27/2022 0830   LYMPHSABS 3.2 12/06/2015 1324   MONOABS 0.8 12/06/2015 1324   EOSABS 0.1 12/06/2015 1324   BASOSABS 0.0 12/06/2015 1324    Hgb A1C Lab Results  Component Value Date   HGBA1C 5.3 08/27/2022             Assessment & Plan:   Assessment and Plan    Abnormal weight gain and overweight BMI 29.33, overweight. Weight gain possibly linked to diet and escitalopram . Discussed phentermine as short-term aid. - Prescribed phentermine 15 mg for one month. - Advised reducing daily calorie intake to 1200 calories. - Encouraged dietary changes focusing on low-carb, high-protein meals. - Discussed potential side effects of phentermine, including heart palpitations. - Advised taking phentermine first thing in the morning to avoid sleep disturbances.  Menstrual migraine Migraines occur during menstruation, managed with sumatriptan . Neurology consultation planned. - Refilled sumatriptan  prescription. - Advised discussing menstrual migraines with neurologist for potential management changes.  Depression and anxiety Managed with escitalopram . Continue current escitalopram  regimen.        RTC in 2 months, follow-up chronic conditions Angeline Laura, NP

## 2024-01-29 ENCOUNTER — Other Ambulatory Visit (HOSPITAL_COMMUNITY): Payer: Self-pay

## 2024-01-29 ENCOUNTER — Telehealth: Payer: Self-pay

## 2024-01-29 NOTE — Telephone Encounter (Signed)
 Pharmacy Patient Advocate Encounter   Received notification from Onbase that prior authorization for Phentermine HCl 15MG  capsules  is required/requested.   Insurance verification completed.   The patient is insured through [facility].   Per test claim: PA required; PA submitted to above mentioned insurance via Latent Key/confirmation #/EOC [id number] Status is pending

## 2024-02-01 ENCOUNTER — Other Ambulatory Visit (HOSPITAL_COMMUNITY): Payer: Self-pay

## 2024-02-15 ENCOUNTER — Other Ambulatory Visit (HOSPITAL_COMMUNITY): Payer: Self-pay

## 2024-02-21 ENCOUNTER — Encounter: Payer: Self-pay | Admitting: Internal Medicine

## 2024-02-22 MED ORDER — ETONOGESTREL-ETHINYL ESTRADIOL 0.12-0.015 MG/24HR VA RING: VAGINAL_RING | VAGINAL | 2 refills | Status: AC

## 2024-02-27 ENCOUNTER — Other Ambulatory Visit: Payer: Self-pay | Admitting: Family Medicine

## 2024-03-01 NOTE — Telephone Encounter (Signed)
 Requested Prescriptions  Pending Prescriptions Disp Refills   escitalopram  (LEXAPRO ) 5 MG tablet [Pharmacy Med Name: ESCITALOPRAM  5MG  TABLETS] 90 tablet 0    Sig: TAKE 1 TABLET(5 MG) BY MOUTH DAILY     Psychiatry:  Antidepressants - SSRI Passed - 03/01/2024 12:23 PM      Passed - Completed PHQ-2 or PHQ-9 in the last 360 days      Passed - Valid encounter within last 6 months    Recent Outpatient Visits           1 month ago Abnormal weight gain   [place] [facility] [place], [place] W, NP   4 months ago Migraine without aura and without status migrainosus, not intractable   [place] [facility] [place], Angeline ORN, NP   5 months ago Encounter for general adult medical examination with abnormal findings   [place] [facility] [place], Angeline ORN, NP   7 months ago Urinary frequency   [place] [facility] [place], Angeline ORN, NP   10 months ago Episodic migraine   [place] [facility] [place], Marsa PARAS, [place]

## 2024-03-03 ENCOUNTER — Other Ambulatory Visit: Payer: Self-pay

## 2024-03-03 ENCOUNTER — Emergency Department (HOSPITAL_COMMUNITY)

## 2024-03-03 ENCOUNTER — Emergency Department (HOSPITAL_COMMUNITY)
Admission: EM | Admit: 2024-03-03 | Discharge: 2024-03-03 | Disposition: A | Discharge status: Home / Self Care | Attending: Emergency Medicine | Admitting: Emergency Medicine

## 2024-03-03 ENCOUNTER — Encounter (HOSPITAL_COMMUNITY): Payer: Self-pay

## 2024-03-03 DIAGNOSIS — K529 Noninfective gastroenteritis and colitis, unspecified: Secondary | ICD-10-CM | POA: Diagnosis not present

## 2024-03-03 DIAGNOSIS — R1031 Right lower quadrant pain: Secondary | ICD-10-CM | POA: Diagnosis present

## 2024-03-03 DIAGNOSIS — R112 Nausea with vomiting, unspecified: Secondary | ICD-10-CM

## 2024-03-03 DIAGNOSIS — R10A3 Flank pain, bilateral: Secondary | ICD-10-CM

## 2024-03-03 LAB — URINALYSIS, ROUTINE W REFLEX MICROSCOPIC
Bilirubin Urine: NEGATIVE
Glucose, UA: NEGATIVE mg/dL
Hgb urine dipstick: NEGATIVE
Ketones, ur: NEGATIVE mg/dL
Leukocytes,Ua: NEGATIVE
Nitrite: NEGATIVE
Protein, ur: NEGATIVE mg/dL
Specific Gravity, Urine: 1.026 (ref 1.005–1.030)
pH: 5 (ref 5.0–8.0)

## 2024-03-03 LAB — BASIC METABOLIC PANEL WITH GFR
Anion gap: 12 (ref 5–15)
BUN: 11 mg/dL (ref 6–20)
CO2: 22 mmol/L (ref 22–32)
Calcium: 9.3 mg/dL (ref 8.9–10.3)
Chloride: 104 mmol/L (ref 98–111)
Creatinine, Ser: 0.79 mg/dL (ref 0.44–1.00)
GFR, Estimated: >60 mL/min (ref >60)
Glucose, Bld: 112 mg/dL — ABNORMAL HIGH (ref 70–99)
Potassium: 3.8 mmol/L (ref 3.5–5.1)
Sodium: 138 mmol/L (ref 135–145)

## 2024-03-03 LAB — CBC WITH DIFFERENTIAL/PLATELET
Abs Immature Granulocytes: 0.03 K/uL (ref 0.00–0.07)
Basophils Absolute: 0 K/uL (ref 0.0–0.1)
Basophils Relative: 0 %
Eosinophils Absolute: 0.1 K/uL (ref 0.0–0.5)
Eosinophils Relative: 1 %
HCT: 40.5 % (ref 36.0–46.0)
Hemoglobin: 13.9 g/dL (ref 12.0–15.0)
Immature Granulocytes: 0 %
Lymphocytes Relative: 33 %
Lymphs Abs: 3.6 K/uL (ref 0.7–4.0)
MCH: 30 pg (ref 26.0–34.0)
MCHC: 34.3 g/dL (ref 30.0–36.0)
MCV: 87.5 fL (ref 80.0–100.0)
Monocytes Absolute: 0.8 K/uL (ref 0.1–1.0)
Monocytes Relative: 7 %
Neutro Abs: 6.3 K/uL (ref 1.7–7.7)
Neutrophils Relative %: 59 %
Platelets: 272 K/uL (ref 150–400)
RBC: 4.63 MIL/uL (ref 3.87–5.11)
RDW: 13.3 % (ref 11.5–15.5)
WBC: 10.8 K/uL — ABNORMAL HIGH (ref 4.0–10.5)
nRBC: 0 % (ref 0.0–0.2)

## 2024-03-03 LAB — HCG, SERUM, QUALITATIVE: Preg, Serum: NEGATIVE

## 2024-03-03 MED ORDER — KETOROLAC TROMETHAMINE 15 MG/ML IJ SOLN
15.0000 mg | Freq: Once | INTRAMUSCULAR | Status: AC
  Administered 2024-03-03: 17:00:00 15 mg via INTRAVENOUS
  Filled 2024-03-03: qty 1

## 2024-03-03 MED ORDER — METOCLOPRAMIDE HCL 5 MG/ML IJ SOLN
10.0000 mg | Freq: Once | INTRAMUSCULAR | Status: AC
  Administered 2024-03-03: 17:00:00 10 mg via INTRAVENOUS
  Filled 2024-03-03: qty 2

## 2024-03-03 MED ORDER — ACETAMINOPHEN 500 MG PO TABS
1000.0000 mg | ORAL_TABLET | Freq: Once | ORAL | Status: AC
  Administered 2024-03-03: 18:00:00 1000 mg via ORAL
  Filled 2024-03-03: qty 2

## 2024-03-03 MED ORDER — SODIUM CHLORIDE 0.9 % IV BOLUS
1000.0000 mL | Freq: Once | INTRAVENOUS | Status: AC
  Administered 2024-03-03: 17:00:00 1000 mL via INTRAVENOUS

## 2024-03-03 MED ORDER — MORPHINE SULFATE (PF) 4 MG/ML IV SOLN
4.0000 mg | Freq: Once | INTRAVENOUS | Status: AC
  Administered 2024-03-03: 17:00:00 4 mg via INTRAVENOUS
  Filled 2024-03-03: qty 1

## 2024-03-03 MED ORDER — DICYCLOMINE HCL 20 MG PO TABS: 20.0000 mg | ORAL_TABLET | Freq: Two times a day (BID) | ORAL | 0 refills | Status: DC

## 2024-03-03 MED ORDER — ONDANSETRON HCL 4 MG PO TABS: 4.0000 mg | ORAL_TABLET | Freq: Four times a day (QID) | ORAL | 0 refills | Status: DC

## 2024-03-03 NOTE — ED Triage Notes (Signed)
 Pt reports having pain in both of her kidneys. Hx of kidney stones, states pain feels similar.

## 2024-03-03 NOTE — Discharge Instructions (Signed)
 You were seen in the emergency department for your back pain, nausea and vomiting.  Your workup did not see any kidney stones.  It is possible that you may have passed a stone earlier in the day.  You have no signs of kidney infection or severe dehydration.  You could also possibly have a viral infection causing gastroenteritis.  I have given you Zofran  that you can take as needed for nausea and you can do Tylenol  or Bentyl  as needed for pain.  You should follow-up with your primary doctor in the next few days to have your symptoms rechecked.  You should return to the emergency department if you are having repetitive vomiting despite the nausea medicine, your pain becomes uncontrollable or if you have any other new or concerning symptoms.

## 2024-03-03 NOTE — ED Provider Triage Note (Signed)
 Emergency Medicine Provider Triage Evaluation Note  Denise Jarvis , a 23 y.o. female  was evaluated in triage.  Pt complains of flank pain with painful urination.  Patient has a history of kidney stones and reports it feels similar, patient does also feel like her urine has been more painful and discolored since this morning.  Review of Systems  Positive: Flank pain Negative: Chest pain, shortness of breath, fever  Physical Exam  BP 111/71 (BP Location: Right Arm)   Pulse 63   Temp 97.7 F (36.5 C)   Resp 20   Ht 5' 3 (1.6 m)   Wt 68 kg   SpO2 98%   BMI 26.57 kg/m  Gen:   Awake, no distress   Resp:  Normal effort lungs are clear to auscultation all fields MSK:   Moves extremities without difficulty  Other:    Medical Decision Making  Medically screening exam initiated at 1:03 PM.  Appropriate orders placed.  Denise Jarvis was informed that the remainder of the evaluation will be completed by another provider, this initial triage assessment does not replace that evaluation, and the importance of remaining in the ED until their evaluation is complete.     Denise Jarvis RAMAN, [place] 03/03/24 1310

## 2024-03-03 NOTE — ED Triage Notes (Signed)
 Pt to er, pt states that she has had a gradual onset of abd pain/back pain, pt states that she is having kidney pain, states she has a hx of kidney stones and this feels similar. Reports some nausea and vomiting.

## 2024-03-03 NOTE — ED Provider Notes (Signed)
 [place] EMERGENCY DEPARTMENT AT [facility] Provider Note   CSN: [id number] Arrival date & time: 03/03/24  1222     Patient presents with: Back Pain   Denise Jarvis is a 23 y.o. female.   Patient is a 23 year old female with a past medical history of migraines and anxiety presenting to the emergency department with back pain.  Patient states that she developed pain in her bilateral flanks last night.  She states it felt similar to kidney stone pain and has been constant since it started.  She states that she did have some right lower quadrant abdominal pain yesterday that has since resolved.  She states she has associated nausea, vomiting and diarrhea.  She denies any fevers.  She denies any dysuria or hematuria.  She denies any known sick contacts.  She also reports that she is having her normal headache today that she has had daily for the last month.  She states she has been taking sumatriptan  at home for her headache but has not taken anything for her back pain or nausea.  The history is provided by the patient and a parent.  Back Pain      Prior to Admission medications  Medication Sig Start Date End Date Taking? Authorizing Provider  dicyclomine  (BENTYL ) 20 MG tablet Take 1 tablet (20 mg total) by mouth 2 (two) times daily. 03/03/24  Yes Ellouise, Magalie Almon K, DO  ondansetron  (ZOFRAN ) 4 MG tablet Take 1 tablet (4 mg total) by mouth every 6 (six) hours. 03/03/24  Yes Ellouise, Niralya Ohanian K, DO  escitalopram  (LEXAPRO ) 5 MG tablet TAKE 1 TABLET(5 MG) BY MOUTH DAILY 03/01/24   Edman, Marsa PARAS, DO  etonogestrel -ethinyl estradiol  (NUVARING) 0.12-0.015 MG/24HR vaginal ring INSERT 1 RING VAGINALLY AND LEAVE IN PLACE FOR 3 WEEKS CONSECUTIVELY, THEN REMOVE FOR 1 WEEK 02/22/24   Antonette Angeline ORN, NP  lovastatin  (MEVACOR ) 20 MG tablet TAKE 1 TABLET(20 MG) BY MOUTH AT BEDTIME 09/10/23   Antonette Angeline ORN, NP  phentermine  15 MG capsule Take 1 capsule (15 mg total) by mouth every  morning. 01/26/24   Antonette Angeline ORN, NP  SUMAtriptan  (IMITREX ) 100 MG tablet Take 1 tablet (100 mg total) by mouth every 2 (two) hours as needed for migraine. May repeat in 2 hours if headache persists or recurs. 01/26/24   Antonette Angeline ORN, NP    Allergies: Patient has no known allergies.    Review of Systems  Musculoskeletal:  Positive for back pain.    Updated Vital Signs BP 121/74 (BP Location: Right Arm)   Pulse 70   Temp 97.9 F (36.6 C) (Oral)   Resp 17   Ht 5' 3 (1.6 m)   Wt 68 kg   SpO2 100%   BMI 26.57 kg/m   Physical Exam Vitals and nursing note reviewed.  Constitutional:      General: She is not in acute distress.    Appearance: Normal appearance.  HENT:     Head: Normocephalic and atraumatic.     Nose: Nose normal.     Mouth/Throat:     Mouth: Mucous membranes are moist.  Eyes:     Extraocular Movements: Extraocular movements intact.     Conjunctiva/sclera: Conjunctivae normal.  Cardiovascular:     Rate and Rhythm: Normal rate and regular rhythm.     Heart sounds: Normal heart sounds.  Pulmonary:     Effort: Pulmonary effort is normal.     Breath sounds: Normal breath sounds.  Abdominal:  General: Abdomen is flat.     Palpations: Abdomen is soft.     Tenderness: There is no abdominal tenderness. There is no right CVA tenderness or left CVA tenderness.  Musculoskeletal:        General: Normal range of motion.     Cervical back: Normal range of motion.     Comments: Bilateral mid thoracic paraspinal muscle tenderness, no overlying skin changes  Skin:    General: Skin is warm and dry.  Neurological:     General: No focal deficit present.     Mental Status: She is alert and oriented to person, place, and time.  Psychiatric:        Mood and Affect: Mood normal.        Behavior: Behavior normal.     (all labs ordered are listed, but only abnormal results are displayed) Labs Reviewed  CBC WITH DIFFERENTIAL/PLATELET - Abnormal; Notable for the  following components:      Result Value   WBC 10.8 (*)    All other components within normal limits  BASIC METABOLIC PANEL WITH GFR - Abnormal; Notable for the following components:   Glucose, Bld 112 (*)    All other components within normal limits  HCG, SERUM, QUALITATIVE  URINALYSIS, ROUTINE W REFLEX MICROSCOPIC    EKG: None  Radiology: CT Renal Stone Study Result Date: 03/03/2024 EXAM: CT UROGRAM 03/03/2024 01:53:44 PM TECHNIQUE: CT of the abdomen and pelvis was performed without the administration of intravenous contrast. Multiplanar reformatted images as well as MIP urogram images are provided for review. Automated exposure control, iterative reconstruction, and/or weight based adjustment of the mA/kV was utilized to reduce the radiation dose to as low as reasonably achievable. COMPARISON: None available. CLINICAL HISTORY: Abdominal/flank pain, stone suspected. FINDINGS: LOWER CHEST: No acute abnormality. LIVER: The liver is unremarkable. GALLBLADDER AND BILE DUCTS: Gallbladder is unremarkable. No biliary ductal dilatation. SPLEEN: No acute abnormality. PANCREAS: No acute abnormality. ADRENAL GLANDS: No acute abnormality. KIDNEYS, URETERS AND BLADDER: No renal calculi. No ureteral atelectasis. No hydronephrosis. No perinephric or periureteral stranding. Urinary bladder is unremarkable. GI AND BOWEL: The stomach, small bowel, and duodenum are normal. Appendix is normal. There is no bowel obstruction. PERITONEUM AND RETROPERITONEUM: No ascites. No free air. VASCULATURE: Aorta is normal in caliber. LYMPH NODES: No lymphadenopathy. REPRODUCTIVE ORGANS: Contraceptive device within the vagina. Uterus and ovaries are normal. BONES AND SOFT TISSUES: No acute osseous abnormality. No focal soft tissue abnormality. IMPRESSION: 1. No nephrolithiasis, obstructive uropathy, or urothelial mass. 2. No acute intra-abdominal or pelvic abnormality. Electronically signed by: Norleen Boxer MD 03/03/2024 02:33 PM  EST RP Workstation: [id number]     Procedures   Medications Ordered in the ED  morphine  (PF) 4 MG/ML injection 4 mg (4 mg Intravenous Given 03/03/24 1726)  sodium chloride  0.9 % bolus 1,000 mL (1,000 mLs Intravenous New Bag/Given 03/03/24 1725)  ketorolac  (TORADOL ) 15 MG/ML injection 15 mg (15 mg Intravenous Given 03/03/24 1726)  metoCLOPramide  (REGLAN ) injection 10 mg (10 mg Intravenous Given 03/03/24 1726)  acetaminophen  (TYLENOL ) tablet 1,000 mg (1,000 mg Oral Given 03/03/24 1740)    Clinical Course as of 03/03/24 1928  Thu Mar 03, 2024  1926 Upon reassessment, patient's pain and nausea has improved. She's stable for discharge home with outpatient follow up. [VK]    Clinical Course User Index [VK] Kingsley, Shawny Borkowski K, DO  Medical Decision Making This patient presents to the ED with chief complaint(s) of back pain, N/V/D with pertinent past medical history of migraines, anxiety which further complicates the presenting complaint. The complaint involves an extensive differential diagnosis and also carries with it a high risk of complications and morbidity.    The differential diagnosis includes nephrolithiasis, pyelonephritis, dehydration, electrolyte abnormality, other intra-abdominal infection, MSK pain, viral syndrome, gastroenteritis  Additional history obtained: Additional history obtained from family Records reviewed Primary Care Documents  ED Course and Reassessment: On patient's arrival she is hemodynamically stable in no acute distress.  She was initially evaluated in triage and had labs and CT renal stone study performed.  The patient's labs showed a mild leukocytosis and is otherwise within normal range.  CT imaging showed no evidence of kidney stone and UA was negative for blood or infection.  Suspect likely viral syndrome/gastroenteritis.  The patient will be started on symptomatic treatment for her headache and pain and will be closely  reassessed.  Independent labs interpretation:  The following labs were independently interpreted: mild leukocytosis, otherwise within normal range  Independent visualization of imaging: - I independently visualized the following imaging with scope of interpretation limited to determining acute life threatening conditions related to emergency care: CT renal stone, which revealed no acute disease  Consultation: - Consulted or discussed management/test interpretation w/ external professional: N/A  Consideration for admission or further workup: Patient has no emergent conditions requiring admission or further work-up at this time and is stable for discharge home with primary care follow-up  Social Determinants of health: N/A    Risk OTC drugs. Prescription drug management.       Final diagnoses:  Bilateral flank pain  Nausea and vomiting, unspecified vomiting type  Gastroenteritis    ED Discharge Orders          Ordered    ondansetron  (ZOFRAN ) 4 MG tablet  Every 6 hours        03/03/24 1928    dicyclomine  (BENTYL ) 20 MG tablet  2 times daily        03/03/24 1928               Kingsley, Cyanne Delmar K, [place] 03/03/24 1928

## 2024-03-04 ENCOUNTER — Encounter: Payer: Self-pay | Admitting: Internal Medicine

## 2024-03-04 ENCOUNTER — Ambulatory Visit: Admitting: Internal Medicine

## 2024-03-04 VITALS — BP 124/72 | Ht 63.0 in | Wt 161.0 lb

## 2024-03-04 DIAGNOSIS — G43009 Migraine without aura, not intractable, without status migrainosus: Secondary | ICD-10-CM | POA: Diagnosis not present

## 2024-03-04 DIAGNOSIS — A084 Viral intestinal infection, unspecified: Secondary | ICD-10-CM | POA: Diagnosis not present

## 2024-03-04 MED ORDER — KETOROLAC TROMETHAMINE 30 MG/ML IJ SOLN
30.0000 mg | Freq: Once | INTRAMUSCULAR | Status: AC
  Administered 2024-03-04: 30 mg via INTRAMUSCULAR

## 2024-03-04 NOTE — Progress Notes (Unsigned)
 "  Subjective:    Patient ID: Denise Jarvis, female    DOB: November 08, 2000, 23 y.o.   MRN: [id number]  HPI   Discussed the use of AI scribe software for clinical note transcription with the patient, who gave verbal consent to proceed.   Denise Jarvis is a 23 year old female with chronic migraines who presents with daily migraine headaches.  She has been experiencing daily migraine headaches for the past month, typically located at the temples or above the right eye. Associated symptoms include dizziness, vision changes, nausea, and vomiting. She has been using sumatriptan  (Imitrex ) 100 mg, but it has been ineffective and may have worsened her symptoms, leading to its discontinuation.  She has a history of trying various medications for migraine prevention and treatment, including Topamax , Maxalt , and Nurtec, but these have not been effective or were not covered by insurance. She has previously used Ubrelvy  samples, which she found effective, but her insurance does not cover it. She was recently prescribed Qulipta by neurology as a daily preventative medication, but has not yet started it.  Yesterday, she visited the emergency room due to severe flank pain and RLQ abdominal pain, which she initially suspected to be a kidney stone. She had associated nausea, vomiting and diarrhea. Labs showed a mildly increase WBC count of 10.8. CT renal stone study was negative. She was treated with IV fluids, Toradol , Reglan , and Tylenol  for a concurrent migraine. She woke up with another migraine today.       Review of Systems     Past Medical History:  Diagnosis Date   Family history of adverse reaction to anesthesia    mother has severe PONV   Frequent headaches     Current Outpatient Medications  Medication Sig Dispense Refill   dicyclomine  (BENTYL ) 20 MG tablet Take 1 tablet (20 mg total) by mouth 2 (two) times daily. 20 tablet 0   escitalopram  (LEXAPRO ) 5 MG tablet TAKE 1 TABLET(5 MG) BY MOUTH  DAILY 90 tablet 0   etonogestrel -ethinyl estradiol  (NUVARING) 0.12-0.015 MG/24HR vaginal ring INSERT 1 RING VAGINALLY AND LEAVE IN PLACE FOR 3 WEEKS CONSECUTIVELY, THEN REMOVE FOR 1 WEEK 3 each 2   lovastatin  (MEVACOR ) 20 MG tablet TAKE 1 TABLET(20 MG) BY MOUTH AT BEDTIME 90 tablet 3   ondansetron  (ZOFRAN ) 4 MG tablet Take 1 tablet (4 mg total) by mouth every 6 (six) hours. 12 tablet 0   phentermine  15 MG capsule Take 1 capsule (15 mg total) by mouth every morning. 30 capsule 0   SUMAtriptan  (IMITREX ) 100 MG tablet Take 1 tablet (100 mg total) by mouth every 2 (two) hours as needed for migraine. May repeat in 2 hours if headache persists or recurs. 10 tablet 0   No current facility-administered medications for this visit.    No Known Allergies  Family History  Problem Relation Age of Onset   Hypertension Mother    Depression Mother        Post-Partum Depression   Healthy Father    Migraines Neg Hx    Seizures Neg Hx    Anxiety disorder Neg Hx    Bipolar disorder Neg Hx    Schizophrenia Neg Hx    ADD / ADHD Neg Hx    Autism Neg Hx    Drug abuse Neg Hx    Suicidality Neg Hx     Social History   Socioeconomic History   Marital status: Single    Spouse name: Not on file   Number of  children: Not on file   Years of education: Not on file   Highest education level: Not on file  Occupational History   Not on file  Tobacco Use   Smoking status: Never   Smokeless tobacco: Never  Vaping Use   Vaping status: Never Used  Substance and Sexual Activity   Alcohol use: No   Drug use: No   Sexual activity: Never  Other Topics Concern   Not on file  Social History Narrative   Pennelope is a 10th grade student [employer]; does well in school. Tereka is active in cross country, winter track and spring track.       Vilma lives with her parents and with Delsa her baby sister (dog).       No family history of learning disabilities.    No IEP or 504 plans in school.     Social Drivers of Health   Tobacco Use: Low Risk (03/03/2024)   Patient History    Smoking Tobacco Use: Never    Smokeless Tobacco Use: Never    Passive Exposure: Not on file  Financial Resource Strain: Low Risk  (02/16/2024)   Received from [facility]   Overall Financial Resource Strain (CARDIA)    Difficulty of Paying Living Expenses: Not hard at all  Food Insecurity: No Food Insecurity (02/16/2024)   Received from [facility]   Epic    Within the past 12 months, you worried that your food would run out before you got the money to buy more.: Never true    Within the past 12 months, the food you bought just didn't last and you didn't have money to get more.: Never true  Transportation Needs: No Transportation Needs (02/16/2024)   Received from [facility] - Transportation    In the past 12 months, has lack of transportation kept you from medical appointments or from getting medications?: No    Lack of Transportation (Non-Medical): No  Physical Activity: Not on file  Stress: Not on file  Social Connections: Not on file  Intimate Partner Violence: Not on file  Depression (PHQ2-9): Low Risk (01/26/2024)   Depression (PHQ2-9)    PHQ-2 Score: 0  Alcohol Screen: Low Risk (08/27/2022)   Alcohol Screen    Last Alcohol Screening Score (AUDIT): 1  Housing: Low Risk  (02/16/2024)   Received from [facility]   Epic    In the last 12 months, was there a time when you were not able to pay the mortgage or rent on time?: No    In the past 12 months, how many times have you moved where you were living?: 1    At any time in the past 12 months, were you homeless or living in a shelter (including now)?: No  Utilities: Not At Risk (02/16/2024)   Received from [facility]   Epic    In the past 12 months has the electric, gas, oil, or water company threatened to shut off services in your home?: No   Health Literacy: Not on file     Constitutional: Patient reports intermittent headaches.  Denies fever, malaise, fatigue, or abrupt weight changes.  HEENT: Denies eye pain, eye redness, ear pain, ringing in the ears, wax buildup, runny nose, nasal congestion, bloody nose, or sore throat. Respiratory: Denies difficulty breathing, shortness of breath, cough or sputum production.   Cardiovascular: Denies chest pain, chest tightness, palpitations or swelling  in the hands or feet.  Gastrointestinal: Patient reports nausea and vomiting.  Denies abdominal pain, bloating, constipation, diarrhea or blood in the stool.  GU: Denies urgency, frequency, pain with urination, burning sensation, blood in urine, odor or discharge. Musculoskeletal: Denies decrease in range of motion, difficulty with gait, muscle pain or joint pain and swelling.  Skin: Denies redness, rashes, lesions or ulcercations.  Neurological: Patient reports sensitivity to light and sound.  Denies dizziness, difficulty with memory, difficulty with speech or problems with balance and coordination.  Psych: Patient has a history of anxiety and depression.  Denies SI/HI.  No other specific complaints in a complete review of systems (except as listed in HPI above).  Objective:   Physical Exam BP 124/72 (BP Location: Left Arm, Patient Position: Sitting, Cuff Size: Normal)   Ht 5' 3 (1.6 m)   Wt 161 lb (73 kg)   LMP 02/09/2024 (Approximate)   BMI 28.52 kg/m  .a   Wt Readings from Last 3 Encounters:  03/03/24 150 lb (68 kg)  01/26/24 165 lb 9.6 oz (75.1 kg)  10/30/23 152 lb (68.9 kg)    General: Appears her stated age, appears uncomfortable but in NAD. Skin: Warm, dry and intact.  HEENT: Head: normal shape and size; Eyes: sclera white, no icterus, conjunctiva pink, PERRLA and EOMs intact;  Cardiovascular: Normal rate. Pulmonary/Chest: Normal effort and positive vesicular breath sounds. No respiratory distress.  Neurological:  Alert and oriented. Coordination normal.    BMET    Component Value Date/Time   NA 138 03/03/2024 1348   K 3.8 03/03/2024 1348   CL 104 03/03/2024 1348   CO2 22 03/03/2024 1348   GLUCOSE 112 (H) 03/03/2024 1348   BUN 11 03/03/2024 1348   CREATININE 0.79 03/03/2024 1348   CREATININE 0.90 08/27/2022 0830   CALCIUM 9.3 03/03/2024 1348   GFRNONAA >60 03/03/2024 1348    Lipid Panel     Component Value Date/Time   CHOL 192 08/27/2022 0830   TRIG 76 08/27/2022 0830   HDL 64 08/27/2022 0830   CHOLHDL 3.0 08/27/2022 0830   VLDL 35.8 09/01/2019 1205   LDLCALC 111 (H) 08/27/2022 0830    CBC    Component Value Date/Time   WBC 10.8 (H) 03/03/2024 1348   RBC 4.63 03/03/2024 1348   HGB 13.9 03/03/2024 1348   HCT 40.5 03/03/2024 1348   PLT 272 03/03/2024 1348   MCV 87.5 03/03/2024 1348   MCH 30.0 03/03/2024 1348   MCHC 34.3 03/03/2024 1348   RDW 13.3 03/03/2024 1348   LYMPHSABS 3.6 03/03/2024 1348   MONOABS 0.8 03/03/2024 1348   EOSABS 0.1 03/03/2024 1348   BASOSABS 0.0 03/03/2024 1348    Hgb A1C Lab Results  Component Value Date   HGBA1C 5.3 08/27/2022             Assessment & Plan:   Assessment and Plan    Chronic migraine Chronic migraines with associated symptoms. Previous treatments ineffective or not covered. Current management with Imitrex  inadequate. - She will pick up qulipta  60 mg daily that was prescribed by neurology . - Provided Ubrelvy   50 mg samples for breakthrough migraines. - Administered 30 mg Toradol  for acute relief. - Discuss with neurology about Ubrelvy  for breakthrough migraines. - Follow up with neurology on January 14th.     ER followup for viral gastroenteritis and migraine -ER notes, labs and imaging reviewed. Symptoms have resolved at this time.  She thinks she may have a food allergy and  has self referred to GI for further evaluation    RTC in 1 months, follow-up chronic conditions Angeline Laura, NP  "

## 2024-03-06 ENCOUNTER — Encounter: Payer: Self-pay | Admitting: Internal Medicine

## 2024-03-06 NOTE — Patient Instructions (Signed)

## 2024-03-07 ENCOUNTER — Ambulatory Visit: Admitting: Internal Medicine

## 2024-03-15 NOTE — Progress Notes (Unsigned)
 "  Subjective:    Patient ID: Lum Grey, female    DOB: 16-Jun-2000, 23 y.o.   MRN: [id number]  HPI  Patient presents to clinic today for 10-month follow-up of chronic conditions.  Migraines: These occur a few times per week.  She is not sure what triggers this. She is taking atogepant as prescribed and sumatriptan  as needed for breakthrough.  She follows with neurology.  Acne: She is not taking any medications for this.  She is not currently following with dermatology.  HLD: Her last LDL was 111, triglycerides 76, 08/2022.  She is taking lovastatin  as prescribed.  She does not consume a low-fat diet.  Anxiety and depression: Chronic, managed on escitalopram .  She is not currently seeing a therapist.  She denies SI/HI.  Review of Systems     Past Medical History:  Diagnosis Date   Family history of adverse reaction to anesthesia    mother has severe PONV   Frequent headaches     Current Outpatient Medications  Medication Sig Dispense Refill   Atogepant (QULIPTA) 60 MG TABS Take 60 mg by mouth. (Patient not taking: Reported on 03/04/2024)     dicyclomine  (BENTYL ) 20 MG tablet Take 1 tablet (20 mg total) by mouth 2 (two) times daily. (Patient not taking: Reported on 03/04/2024) 20 tablet 0   escitalopram  (LEXAPRO ) 5 MG tablet TAKE 1 TABLET(5 MG) BY MOUTH DAILY 90 tablet 0   etonogestrel -ethinyl estradiol  (NUVARING) 0.12-0.015 MG/24HR vaginal ring INSERT 1 RING VAGINALLY AND LEAVE IN PLACE FOR 3 WEEKS CONSECUTIVELY, THEN REMOVE FOR 1 WEEK 3 each 2   lovastatin  (MEVACOR ) 20 MG tablet TAKE 1 TABLET(20 MG) BY MOUTH AT BEDTIME 90 tablet 3   ondansetron  (ZOFRAN ) 4 MG tablet Take 1 tablet (4 mg total) by mouth every 6 (six) hours. (Patient not taking: Reported on 03/04/2024) 12 tablet 0   phentermine  15 MG capsule Take 1 capsule (15 mg total) by mouth every morning. 30 capsule 0   SUMAtriptan  (IMITREX ) 100 MG tablet Take 1 tablet (100 mg total) by mouth every 2 (two) hours as needed for  migraine. May repeat in 2 hours if headache persists or recurs. 10 tablet 0   No current facility-administered medications for this visit.    No Known Allergies  Family History  Problem Relation Age of Onset   Hypertension Mother    Depression Mother        Post-Partum Depression   Healthy Father    Migraines Neg Hx    Seizures Neg Hx    Anxiety disorder Neg Hx    Bipolar disorder Neg Hx    Schizophrenia Neg Hx    ADD / ADHD Neg Hx    Autism Neg Hx    Drug abuse Neg Hx    Suicidality Neg Hx     Social History   Socioeconomic History   Marital status: Single    Spouse name: Not on file   Number of children: Not on file   Years of education: Not on file   Highest education level: Not on file  Occupational History   Not on file  Tobacco Use   Smoking status: Never   Smokeless tobacco: Never  Vaping Use   Vaping status: Never Used  Substance and Sexual Activity   Alcohol use: No   Drug use: No   Sexual activity: Never  Other Topics Concern   Not on file  Social History Narrative   Drew is a 10th grade student [employer]; does well in school. Bilan is active in cross country, winter track and spring track.       Ziara lives with her parents and with Delsa her baby sister (dog).       No family history of learning disabilities.    No IEP or 504 plans in school.    Social Drivers of Health   Tobacco Use: Low Risk (03/06/2024)   Patient History    Smoking Tobacco Use: Never    Smokeless Tobacco Use: Never    Passive Exposure: Not on file  Financial Resource Strain: Low Risk  (02/16/2024)   Received from [facility]   Overall Financial Resource Strain (CARDIA)    Difficulty of Paying Living Expenses: Not hard at all  Food Insecurity: No Food Insecurity (02/16/2024)   Received from [facility]   Epic    Within the past 12 months, you worried that your food would run out before you got the money to buy more.:  Never true    Within the past 12 months, the food you bought just didn't last and you didn't have money to get more.: Never true  Transportation Needs: No Transportation Needs (02/16/2024)   Received from [facility] - Transportation    In the past 12 months, has lack of transportation kept you from medical appointments or from getting medications?: No    Lack of Transportation (Non-Medical): No  Physical Activity: Not on file  Stress: Not on file  Social Connections: Not on file  Intimate Partner Violence: Not on file  Depression (PHQ2-9): Low Risk (01/26/2024)   Depression (PHQ2-9)    PHQ-2 Score: 0  Alcohol Screen: Low Risk (08/27/2022)   Alcohol Screen    Last Alcohol Screening Score (AUDIT): 1  Housing: Low Risk  (02/16/2024)   Received from [facility]   Epic    In the last 12 months, was there a time when you were not able to pay the mortgage or rent on time?: No    In the past 12 months, how many times have you moved where you were living?: 1    At any time in the past 12 months, were you homeless or living in a shelter (including now)?: No  Utilities: Not At Risk (02/16/2024)   Received from [facility]   Epic    In the past 12 months has the electric, gas, oil, or water company threatened to shut off services in your home?: No  Health Literacy: Not on file     Constitutional: Patient reports intermittent headaches.  Denies fever, malaise, fatigue or abrupt weight changes.  HEENT: Denies eye pain, eye redness, ear pain, ringing in the ears, wax buildup, runny nose, nasal congestion, bloody nose, or sore throat. Respiratory: Denies difficulty breathing, shortness of breath, cough or sputum production.   Cardiovascular: Denies chest pain, chest tightness, palpitations or swelling in the hands or feet.  Gastrointestinal: Denies abdominal pain, bloating, constipation, diarrhea or blood in the stool.  GU: Denies  urgency, frequency, pain with urination, burning sensation, blood in urine, odor or discharge. Musculoskeletal: Denies decrease in range of motion, difficulty with gait, muscle pain or joint pain and swelling.  Skin: Patient reports acne.  Denies redness, rashes, lesions or ulcercations.  Neurological: Denies dizziness, difficulty with memory, difficulty with speech or problems with balance and coordination.  Psych: Patient has a history of anxiety and depression.  Denies SI/HI.  No other specific complaints in a complete review of systems (except as listed in HPI above).  Objective:   Physical Exam LMP 02/09/2024 (Approximate)    Wt Readings from Last 3 Encounters:  03/04/24 161 lb (73 kg)  03/03/24 150 lb (68 kg)  01/26/24 165 lb 9.6 oz (75.1 kg)    General: Appears her stated age, well developed, well nourished in NAD. Cardiovascular: Normal rate and rhythm. S1,S2 noted.  No murmur, rubs or gallops noted.  Pulmonary/Chest: Normal effort and positive vesicular breath sounds. No respiratory distress. No wheezes, rales or ronchi noted.  Musculoskeletal:  No difficulty with gait.  Neurological: Alert and oriented. Coordination normal.  Psychiatric: Mood and affect normal. Behavior is normal. Judgment and thought content normal.    BMET    Component Value Date/Time   NA 138 03/03/2024 1348   K 3.8 03/03/2024 1348   CL 104 03/03/2024 1348   CO2 22 03/03/2024 1348   GLUCOSE 112 (H) 03/03/2024 1348   BUN 11 03/03/2024 1348   CREATININE 0.79 03/03/2024 1348   CREATININE 0.90 08/27/2022 0830   CALCIUM 9.3 03/03/2024 1348   GFRNONAA >60 03/03/2024 1348    Lipid Panel     Component Value Date/Time   CHOL 192 08/27/2022 0830   TRIG 76 08/27/2022 0830   HDL 64 08/27/2022 0830   CHOLHDL 3.0 08/27/2022 0830   VLDL 35.8 09/01/2019 1205   LDLCALC 111 (H) 08/27/2022 0830    CBC    Component Value Date/Time   WBC 10.8 (H) 03/03/2024 1348   RBC 4.63 03/03/2024 1348   HGB 13.9  03/03/2024 1348   HCT 40.5 03/03/2024 1348   PLT 272 03/03/2024 1348   MCV 87.5 03/03/2024 1348   MCH 30.0 03/03/2024 1348   MCHC 34.3 03/03/2024 1348   RDW 13.3 03/03/2024 1348   LYMPHSABS 3.6 03/03/2024 1348   MONOABS 0.8 03/03/2024 1348   EOSABS 0.1 03/03/2024 1348   BASOSABS 0.0 03/03/2024 1348    Hgb A1C Lab Results  Component Value Date   HGBA1C 5.3 08/27/2022            Assessment & Plan:      RTC in 6 months for your annual exam Angeline Laura, NP  "

## 2024-03-16 ENCOUNTER — Encounter: Payer: Self-pay | Admitting: Internal Medicine

## 2024-03-16 ENCOUNTER — Ambulatory Visit: Admitting: Internal Medicine

## 2024-03-16 VITALS — BP 118/74 | Ht 63.0 in | Wt 162.8 lb

## 2024-03-16 DIAGNOSIS — L709 Acne, unspecified: Secondary | ICD-10-CM | POA: Diagnosis not present

## 2024-03-16 DIAGNOSIS — E663 Overweight: Secondary | ICD-10-CM | POA: Diagnosis not present

## 2024-03-16 DIAGNOSIS — G43909 Migraine, unspecified, not intractable, without status migrainosus: Secondary | ICD-10-CM | POA: Diagnosis not present

## 2024-03-16 DIAGNOSIS — F330 Major depressive disorder, recurrent, mild: Secondary | ICD-10-CM | POA: Insufficient documentation

## 2024-03-16 DIAGNOSIS — E7800 Pure hypercholesterolemia, unspecified: Secondary | ICD-10-CM | POA: Diagnosis not present

## 2024-03-16 DIAGNOSIS — F411 Generalized anxiety disorder: Secondary | ICD-10-CM

## 2024-03-16 DIAGNOSIS — Z6828 Body mass index (BMI) 28.0-28.9, adult: Secondary | ICD-10-CM | POA: Diagnosis not present

## 2024-03-16 NOTE — Assessment & Plan Note (Signed)
 Encouraged diet and exercise for weight loss ?

## 2024-03-16 NOTE — Assessment & Plan Note (Signed)
Managed without meds She does plan on following up with dermatology

## 2024-03-16 NOTE — Assessment & Plan Note (Signed)
 Continue escitalopram  5 mg daily although she is considering weaning this to see if she can stop it Support offered

## 2024-03-16 NOTE — Assessment & Plan Note (Signed)
 Continue lovastatin  20 mg daily Encouraged her to consume a low-fat diet

## 2024-03-16 NOTE — Patient Instructions (Signed)

## 2024-03-16 NOTE — Assessment & Plan Note (Signed)
 Continue atogepant 60 mg daily Continue sumatriptan  100 mg as needed for breakthrough Try to identify triggers and avoid them She will continue to follow with neurology

## 2024-03-21 ENCOUNTER — Encounter: Payer: Self-pay | Admitting: Emergency Medicine

## 2024-03-21 ENCOUNTER — Ambulatory Visit
Admission: EM | Admit: 2024-03-21 | Discharge: 2024-03-21 | Disposition: A | Discharge status: Home / Self Care | Attending: Emergency Medicine | Admitting: Emergency Medicine

## 2024-03-21 DIAGNOSIS — R399 Unspecified symptoms and signs involving the genitourinary system: Secondary | ICD-10-CM | POA: Diagnosis not present

## 2024-03-21 DIAGNOSIS — N390 Urinary tract infection, site not specified: Secondary | ICD-10-CM | POA: Insufficient documentation

## 2024-03-21 LAB — POCT URINE DIPSTICK
Bilirubin, UA: NEGATIVE
Glucose, UA: NEGATIVE mg/dL
Ketones, POC UA: NEGATIVE mg/dL
Nitrite, UA: NEGATIVE
Protein Ur, POC: >=300 mg/dL — AB
Spec Grav, UA: 1.025
Urobilinogen, UA: 1 U/dL
pH, UA: 7

## 2024-03-21 MED ORDER — PHENAZOPYRIDINE HCL 200 MG PO TABS: 200.0000 mg | ORAL_TABLET | Freq: Three times a day (TID) | ORAL | 0 refills | Status: AC

## 2024-03-21 MED ORDER — NITROFURANTOIN MONOHYD MACRO 100 MG PO CAPS: 100.0000 mg | ORAL_CAPSULE | Freq: Two times a day (BID) | ORAL | 0 refills | Status: AC

## 2024-03-21 NOTE — ED Provider Notes (Addendum)
 " [facility]    CSN: [id number] Arrival date & time: 03/21/24  1737      History   Chief Complaint Chief Complaint  Patient presents with   Dysuria    HPI Denise Jarvis is a 24 y.o. female.   HPI  24 year old female with past medical history significant for episodic migraines, GAD, hyperlipidemia, MDD presents for evaluation of urinary frequency and dysuria that began today.  No fever, low back pain, or suprapubic pain.  Also no vaginal discharge or itching.  She has noticed some blood in the urine.  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/2024   Overweight with body mass index (BMI) of 28 to 28.9 in adult 09/30/2023   HLD (hyperlipidemia) 07/18/2021   GAD (generalized anxiety disorder) 06/28/2020   Episodic migraine 04/21/2018   Acne 04/21/2018    Past Surgical History:  Procedure Laterality Date   SINUSOTOMY  2003   TONSILLECTOMY  2008   VULVECTOMY PARTIAL N/A 12/30/2017   Procedure: VULVECTOMY PARTIAL;  Surgeon: Starla Harland BROCKS, MD;  Location: [facility];  Service: Gynecology;  Laterality: N/A;    OB History     Gravida  0   Para  0   Term  0   Preterm  0   AB  0   Living  0      SAB  0   IAB  0   Ectopic  0   Multiple  0   Live Births  0            Home Medications    Prior to Admission medications  Medication Sig Start Date End Date Taking? Authorizing Provider  escitalopram  (LEXAPRO ) 5 MG tablet TAKE 1 TABLET(5 MG) BY MOUTH DAILY 03/01/24  Yes Karamalegos, Marsa PARAS, DO  etonogestrel -ethinyl estradiol  (NUVARING) 0.12-0.015 MG/24HR vaginal ring INSERT 1 RING VAGINALLY AND LEAVE IN PLACE FOR 3 WEEKS CONSECUTIVELY, THEN REMOVE FOR 1 WEEK 02/22/24  Yes Baity, Angeline ORN, NP  nitrofurantoin ,  macrocrystal-monohydrate, (MACROBID ) 100 MG capsule Take 1 capsule (100 mg total) by mouth 2 (two) times daily. 03/21/24  Yes Bernardino Ditch, NP  phenazopyridine  (PYRIDIUM ) 200 MG tablet Take 1 tablet (200 mg total) by mouth 3 (three) times daily. 03/21/24  Yes Bernardino Ditch, NP  Atogepant (QULIPTA) 60 MG TABS Take 60 mg by mouth. 03/04/24   [provider]  lovastatin  (MEVACOR ) 20 MG tablet TAKE 1 TABLET(20 MG) BY MOUTH AT BEDTIME 09/10/23   Antonette Angeline ORN, NP  SUMAtriptan  (IMITREX ) 100 MG tablet Take 1 tablet (100 mg total) by mouth every 2 (two) hours as needed for migraine. May repeat in 2 hours if headache persists or recurs. 01/26/24   Antonette Angeline ORN, NP    Family History Family History  Problem Relation Age of Onset   Hypertension Mother    Depression Mother        Post-Partum Depression   Healthy Father    Migraines Neg Hx    Seizures Neg Hx    Anxiety disorder Neg Hx    Bipolar disorder Neg Hx    Schizophrenia Neg Hx    ADD / ADHD Neg Hx    Autism Neg Hx    Drug abuse Neg Hx  Suicidality Neg Hx     Social History Social History[1]   Allergies   Patient has no known allergies.   Review of Systems Review of Systems  Constitutional:  Negative for fever.  Gastrointestinal:  Negative for abdominal pain.  Genitourinary:  Positive for dysuria, frequency, hematuria and urgency. Negative for vaginal discharge and vaginal pain.  Musculoskeletal:  Negative for back pain.     Physical Exam Triage Vital Signs ED Triage Vitals  Encounter Vitals Group     BP      Girls Systolic BP Percentile      Girls Diastolic BP Percentile      Boys Systolic BP Percentile      Boys Diastolic BP Percentile      Pulse      Resp      Temp      Temp src      SpO2      Weight      Height      Head Circumference      Peak Flow      Pain Score      Pain Loc      Pain Education      Exclude from Growth Chart    No data found.  Updated Vital Signs BP 129/80 (BP Location:  Right Arm)   Pulse 64   Temp 98.4 F (36.9 C) (Oral)   Resp 17   Wt 150 lb (68 kg)   LMP 03/18/2024 (Approximate)   SpO2 98%   BMI 26.57 kg/m   Visual Acuity Right Eye Distance:   Left Eye Distance:   Bilateral Distance:    Right Eye Near:   Left Eye Near:    Bilateral Near:     Physical Exam Vitals and nursing note reviewed.  Constitutional:      Appearance: Normal appearance. She is not ill-appearing.  HENT:     Head: Normocephalic and atraumatic.  Cardiovascular:     Rate and Rhythm: Normal rate and regular rhythm.     Pulses: Normal pulses.     Heart sounds: Normal heart sounds. No murmur heard.    No friction rub. No gallop.  Pulmonary:     Effort: Pulmonary effort is normal.     Breath sounds: Normal breath sounds. No wheezing, rhonchi or rales.  Abdominal:     Tenderness: There is no right CVA tenderness or left CVA tenderness.  Skin:    General: Skin is warm and dry.     Capillary Refill: Capillary refill takes less than 2 seconds.     Findings: No rash.  Neurological:     General: No focal deficit present.     Mental Status: She is alert and oriented to person, place, and time.      UC Treatments / Results  Labs (all labs ordered are listed, but only abnormal results are displayed) Labs Reviewed  POCT URINE DIPSTICK - Abnormal; Notable for the following components:      Result Value   Clarity, UA cloudy (*)    Blood, UA large (*)    Protein Ur, POC >=300 (*)    Leukocytes, UA Moderate (2+) (*)    All other components within normal limits  URINE CULTURE    EKG   Radiology No results found.  Procedures Procedures (including critical care time)  Medications Ordered in UC Medications - No data to display  Initial Impression / Assessment and Plan / UC Course  I have reviewed the triage vital  signs and the nursing notes.  Pertinent labs & imaging results that were available during my care of the patient were reviewed by me and considered  in my medical decision making (see chart for details).   Patient is a pleasant, nontoxic-appearing 24 year old female presenting for evaluation of acute onset UTI symptoms.  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  She has had dysuria, urgency, frequency, and has noticed some blood in her urine today.  I will order a urine dip to assess for presence of UTI.  Urine dip is cloudy in appearance with large RBCs, greater than 300 protein, and moderate leukocyte esterase.  I will send urine for culture.  I will discharge patient home with diagnosis of UTI and start her on Macrobid  100 mg twice daily for 5 days along with Pyridium  200 mg every 8 hours as needed for urinary discomfort.  I will encourage her to increase oral fluid intake so that she increases urine production helps to flush her urinary tract.  Return precautions reviewed.  Urine culture showed greater than 50,000 colony-forming units of Escherichia coli.  Bacteria susceptible to Macrobid .  No change in therapy at this time.   Final Clinical Impressions(s) / UC Diagnoses   Final diagnoses:  UTI symptoms  Lower urinary tract infectious disease     Discharge Instructions      Take the Macrobid  twice daily for 5 days with food for treatment of urinary tract infection.  Use the Pyridium  every 8 hours as needed for urinary discomfort.  This will turn your urine a bright red-orange.  Increase your oral fluid intake so that you increase your urine production and or flushing your urinary system.  Take an over-the-counter probiotic, such as Culturelle-Align-Activia, 1 hour after each dose of antibiotic to prevent diarrhea or yeast infections from forming.  We will culture urine and change the antibiotics if necessary.  Return for reevaluation, or see your primary care provider, for any new or worsening symptoms.      ED Prescriptions      Medication Sig Dispense Auth. Provider   nitrofurantoin , macrocrystal-monohydrate, (MACROBID ) 100 MG capsule Take 1 capsule (100 mg total) by mouth 2 (two) times daily. 10 capsule Bernardino Ditch, NP   phenazopyridine  (PYRIDIUM ) 200 MG tablet Take 1 tablet (200 mg total) by mouth 3 (three) times daily. 6 tablet Bernardino Ditch, NP      PDMP not reviewed this encounter.    Bernardino Ditch, NP 03/21/24 1817     [1]  Social History Tobacco Use   Smoking status: Never   Smokeless tobacco: Never  Vaping Use   Vaping status: Never Used  Substance Use Topics   Alcohol use: No   Drug use: No     Bernardino Ditch, NP 03/25/24 0756  "

## 2024-03-21 NOTE — ED Triage Notes (Signed)
 Sx x 1 day  Dysuria  Urinary frequency

## 2024-03-21 NOTE — Discharge Instructions (Addendum)

## 2024-03-23 ENCOUNTER — Ambulatory Visit: Payer: Self-pay

## 2024-03-23 LAB — URINE CULTURE
Culture: 50000 — AB
Special Requests: NORMAL

## 2024-04-22 ENCOUNTER — Encounter: Payer: Self-pay | Admitting: Internal Medicine

## 2024-04-22 ENCOUNTER — Other Ambulatory Visit: Payer: Self-pay

## 2024-04-22 MED ORDER — SUMATRIPTAN SUCCINATE 100 MG PO TABS: 100.0000 mg | ORAL_TABLET | ORAL | 0 refills | Status: AC | PRN

## 2024-09-30 ENCOUNTER — Encounter: Admitting: Internal Medicine
